# Patient Record
Sex: Female | Born: 1935 | Race: White | Hispanic: No | Smoking: Never smoker
Health system: Southern US, Community
[De-identification: ages and names within clinical notes are randomized; demographics above are authoritative.]

## PROBLEM LIST (undated history)

## (undated) DIAGNOSIS — C50919 Malignant neoplasm of unspecified site of unspecified female breast: Secondary | ICD-10-CM

## (undated) DIAGNOSIS — D509 Iron deficiency anemia, unspecified: Secondary | ICD-10-CM

## (undated) DIAGNOSIS — F0390 Unspecified dementia without behavioral disturbance: Secondary | ICD-10-CM

## (undated) DIAGNOSIS — I10 Essential (primary) hypertension: Secondary | ICD-10-CM

## (undated) HISTORY — PX: OTHER SURGICAL HISTORY: SHX169

---

## 2011-02-09 DIAGNOSIS — R002 Palpitations: Secondary | ICD-10-CM

## 2011-02-09 HISTORY — DX: Palpitations: R00.2

## 2016-03-03 ENCOUNTER — Encounter (HOSPITAL_COMMUNITY): Payer: Self-pay | Admitting: *Deleted

## 2016-03-03 ENCOUNTER — Emergency Department (HOSPITAL_COMMUNITY)
Admission: EM | Admit: 2016-03-03 | Discharge: 2016-03-04 | Disposition: A | Discharge status: Home / Self Care | Payer: Medicare Other | Attending: Emergency Medicine | Admitting: Emergency Medicine

## 2016-03-03 ENCOUNTER — Emergency Department (HOSPITAL_COMMUNITY): Payer: Medicare Other

## 2016-03-03 DIAGNOSIS — R930 Abnormal findings on diagnostic imaging of skull and head, not elsewhere classified: Secondary | ICD-10-CM | POA: Diagnosis not present

## 2016-03-03 DIAGNOSIS — S3992XA Unspecified injury of lower back, initial encounter: Secondary | ICD-10-CM | POA: Diagnosis present

## 2016-03-03 DIAGNOSIS — S32010A Wedge compression fracture of first lumbar vertebra, initial encounter for closed fracture: Secondary | ICD-10-CM

## 2016-03-03 DIAGNOSIS — S0181XA Laceration without foreign body of other part of head, initial encounter: Secondary | ICD-10-CM | POA: Diagnosis not present

## 2016-03-03 DIAGNOSIS — W1839XA Other fall on same level, initial encounter: Secondary | ICD-10-CM | POA: Insufficient documentation

## 2016-03-03 DIAGNOSIS — W19XXXA Unspecified fall, initial encounter: Secondary | ICD-10-CM

## 2016-03-03 DIAGNOSIS — S32018A Other fracture of first lumbar vertebra, initial encounter for closed fracture: Secondary | ICD-10-CM | POA: Diagnosis not present

## 2016-03-03 DIAGNOSIS — Y939 Activity, unspecified: Secondary | ICD-10-CM | POA: Diagnosis not present

## 2016-03-03 DIAGNOSIS — Y92002 Bathroom of unspecified non-institutional (private) residence single-family (private) house as the place of occurrence of the external cause: Secondary | ICD-10-CM | POA: Insufficient documentation

## 2016-03-03 DIAGNOSIS — D709 Neutropenia, unspecified: Secondary | ICD-10-CM | POA: Diagnosis not present

## 2016-03-03 DIAGNOSIS — Y999 Unspecified external cause status: Secondary | ICD-10-CM | POA: Insufficient documentation

## 2016-03-03 DIAGNOSIS — D649 Anemia, unspecified: Secondary | ICD-10-CM

## 2016-03-03 HISTORY — DX: Unspecified dementia, unspecified severity, without behavioral disturbance, psychotic disturbance, mood disturbance, and anxiety: F03.90

## 2016-03-03 LAB — CBC WITH DIFFERENTIAL/PLATELET
BASOS ABS: 0 10*3/uL (ref 0.0–0.1)
Basophils Relative: 0 %
EOS ABS: 0 10*3/uL (ref 0.0–0.7)
EOS PCT: 0 %
HCT: 36.1 % (ref 36.0–46.0)
Hemoglobin: 11.4 g/dL — ABNORMAL LOW (ref 12.0–15.0)
LYMPHS PCT: 33 %
Lymphs Abs: 0.8 10*3/uL (ref 0.7–4.0)
MCH: 27.7 pg (ref 26.0–34.0)
MCHC: 31.6 g/dL (ref 30.0–36.0)
MCV: 87.8 fL (ref 78.0–100.0)
MONO ABS: 0.3 10*3/uL (ref 0.1–1.0)
Monocytes Relative: 12 %
Neutro Abs: 1.4 10*3/uL — ABNORMAL LOW (ref 1.7–7.7)
Neutrophils Relative %: 55 %
PLATELETS: 161 10*3/uL (ref 150–400)
RBC: 4.11 MIL/uL (ref 3.87–5.11)
RDW: 14.5 % (ref 11.5–15.5)
WBC: 2.5 10*3/uL — ABNORMAL LOW (ref 4.0–10.5)

## 2016-03-03 LAB — COMPREHENSIVE METABOLIC PANEL
ALT: 15 U/L (ref 14–54)
AST: 22 U/L (ref 15–41)
Albumin: 3.4 g/dL — ABNORMAL LOW (ref 3.5–5.0)
Alkaline Phosphatase: 47 U/L (ref 38–126)
Anion gap: 5 (ref 5–15)
BUN: 28 mg/dL — AB (ref 6–20)
CHLORIDE: 111 mmol/L (ref 101–111)
CO2: 27 mmol/L (ref 22–32)
Calcium: 8.8 mg/dL — ABNORMAL LOW (ref 8.9–10.3)
Creatinine, Ser: 0.6 mg/dL (ref 0.44–1.00)
GFR calc non Af Amer: >60 mL/min (ref >60)
Glucose, Bld: 92 mg/dL (ref 65–99)
POTASSIUM: 3.8 mmol/L (ref 3.5–5.1)
SODIUM: 143 mmol/L (ref 135–145)
Total Bilirubin: 0.3 mg/dL (ref 0.3–1.2)
Total Protein: 5.9 g/dL — ABNORMAL LOW (ref 6.5–8.1)

## 2016-03-03 LAB — PROTIME-INR
INR: 0.96
PROTHROMBIN TIME: 12.8 s (ref 11.4–15.2)

## 2016-03-03 LAB — I-STAT TROPONIN, ED: Troponin i, poc: 0.01 ng/mL (ref 0.00–0.08)

## 2016-03-03 LAB — I-STAT CG4 LACTIC ACID, ED: LACTIC ACID, VENOUS: 0.82 mmol/L (ref 0.5–1.9)

## 2016-03-03 MED ORDER — TETANUS-DIPHTH-ACELL PERTUSSIS 5-2.5-18.5 LF-MCG/0.5 IM SUSP
0.5000 mL | Freq: Once | INTRAMUSCULAR | Status: AC
  Administered 2016-03-03: 0.5 mL via INTRAMUSCULAR
  Filled 2016-03-03: qty 0.5

## 2016-03-03 NOTE — ED Notes (Signed)
Family at bedside. 

## 2016-03-03 NOTE — ED Notes (Signed)
Bed: [facility] Expected date:  Expected time:  Means of arrival:  Comments: EMS-fall 

## 2016-03-03 NOTE — ED Notes (Signed)
Caregiver is at bedside.  Pt denies any pain at this time.

## 2016-03-03 NOTE — ED Provider Notes (Signed)
[facility] DEPT Provider Note   CSN: [id number] Arrival date & time: 03/03/16  1919  By signing my name below, I, Gwenlyn Fudge, attest that this documentation has been prepared under the direction and in the presence of [address] [place]. Electronically Signed: Gwenlyn Fudge, ED Scribe. 03/03/16. 8:44 PM.   History   Chief Complaint Chief Complaint  Patient presents with  . Fall   LEVEL 5 CAVEAT DUE TO DEMENTIA  The history is provided by a caregiver. No language interpreter was used.  Fall  This is a new problem. The current episode started 1 to 2 hours ago. The problem occurs rarely. The problem has not changed since onset.Nothing aggravates the symptoms. Nothing relieves the symptoms. She has tried nothing for the symptoms. The treatment provided no relief.   HPI Comments: Amanda Randall is a 83 y.o. female with a PMHx of dementia, HTN, HLD, periph neuropathy, iron and B12 deficiency anemia, and seborrheic dermatitis of the scalp, who presents to the Emergency Department from [facility] accompanied by her caregiver, complaining of a hematoma and laceration to the right forehead s/p an unwittnessed fall in the bathroom around 7 PM ~1hr PTA. LEVEL 5 CAVEAT DUE TO DEMENTIA, all history provided by caregiver. Per caregiver, pt was using the bathroom when she fell and struck her head. According to her caregiver, she had been acting "abnormal" all day, even prior to the fall, for example she urinated in the closet which is abnormal behavior for her; she reports she hasn't been any more confused since the fall, but has been more fidgety since her fall. Caregiver has noticed the patient has had a productive cough recently, although she's not sure when it started. Caregiver reports that she normally is not oriented to place and time, so she is currently oriented at baseline. She had medications changed recently in order to increase her weight, but she's not sure of what medications she takes or  what was recently changed. Caregiver is unsure if pt currently takes blood thinners. Caregiver denies any increased urinary frequency, malodorous urine, hematuria, recent fevers, rhinorrhea, LOC, vomiting, diarrhea, constipation, or any other symptoms; ROS limited due to dementia; pt denies any pain anywhere. Unaware of last tetanus.   Chart review does not reveal any blood thinner use. Per chart review, pt was started on remeron; pt on lopressor, lisinopril, iron supplement, namenda, exelon, and donepezil; was stopped on lyrica last year.    Past Medical History:  Diagnosis Date  . Dementia     There are no active problems to display for this patient.   History reviewed. No pertinent surgical history.  OB History    No data available       Home Medications    Prior to Admission medications   Not on File    Family History No family history on file.  Social History Social History  Substance Use Topics  . Smoking status: Never Smoker  . Smokeless tobacco: Never Used  . Alcohol use No     Allergies   Patient has no known allergies.   Review of Systems Review of Systems  Unable to perform ROS: Dementia  Constitutional: Negative for fever.  HENT: Positive for facial swelling (R forehead lac). Negative for rhinorrhea.   Respiratory: Positive for cough.   Gastrointestinal: Negative for constipation, diarrhea and vomiting.  Genitourinary: Negative for frequency and hematuria.       No Malodorous urine  Skin: Positive for wound.  Neurological: Negative for syncope.  Hematological:  Bruises/bleeds easily: unknown.  Psychiatric/Behavioral: Positive for confusion (prior to fall).  LEVEL 5 CAVEAT DUE TO DEMENTIA  Physical Exam Updated Vital Signs BP 145/69 (BP Location: Left Arm)   Pulse 74   Temp 98.6 F (37 C) (Oral)   Resp 17   SpO2 99%   Physical Exam  Constitutional: Vital signs are normal. She appears well-developed and well-nourished.  Non-toxic  appearance. No distress.  Afebrile, nontoxic, NAD  HENT:  Head: Normocephalic. Head is with laceration. Head is without raccoon's eyes and without Battle's sign.  Mouth/Throat: Oropharynx is clear and moist and mucous membranes are normal.  Small 1 cm linear laceration to the right forehead with small hematoma, and another small skin tear underneath the laceration; with no ongoing bleeding, fairly superficial and no retained FBs, no Raccoon's eyes or Battle's signs, no skull depression or bony instability  Eyes: Conjunctivae and EOM are normal. Pupils are equal, round, and reactive to light. Right eye exhibits no discharge. Left eye exhibits no discharge.  PERRL, EOMI, no nystagmus  Neck: Normal range of motion. Neck supple. No spinous process tenderness and no muscular tenderness present. No neck rigidity. Normal range of motion present.  FROM intact without spinous process TTP, no bony stepoffs or deformities, no paraspinous muscle TTP or muscle spasms. No rigidity or meningeal signs. No bruising or swelling.   Cardiovascular: Normal rate, regular rhythm, normal heart sounds and intact distal pulses.  Exam reveals no gallop and no friction rub.   No murmur heard. Pulmonary/Chest: Effort normal and breath sounds normal. No respiratory distress. She has no decreased breath sounds. She has no wheezes. She has no rhonchi. She has no rales.  Abdominal: Soft. Normal appearance and bowel sounds are normal. She exhibits no distension. There is no tenderness. There is no rigidity, no rebound, no guarding, no CVA tenderness, no tenderness at McBurney's point and negative Murphy's sign.  Musculoskeletal: Normal range of motion.  MAE x4 Strength and sensation grossly intact in all extremities Distal pulses intact No pelvic instability or tenderness No bony tenderness to all extremities  C-spine as above, all other spinal levels nonTTP without bony stepoffs or deformities   Neurological: She is alert. She  has normal strength. No cranial nerve deficit or sensory deficit.  A&O x1 to self, but not to place or time which is baseline per caregiver No gross neuro deficits  strength and sensation grossly intact  Skin: Skin is warm and dry. Laceration noted. No rash noted.  Right forehead lac as mentioned above  Psychiatric: She has a normal mood and affect.  Nursing note and vitals reviewed.  ED Treatments / Results  DIAGNOSTIC STUDIES: Oxygen Saturation is 99% on RA, normal by my interpretation.    COORDINATION OF CARE: 8:41 PM Discussed treatment plan with caregiver at bedside which includes Chest XR and CT Head. Labs (all labs ordered are listed, but only abnormal results are displayed) Labs Reviewed  CBC WITH DIFFERENTIAL/PLATELET - Abnormal; Notable for the following:       Result Value   WBC 2.5 (*)    Hemoglobin 11.4 (*)    Neutro Abs 1.4 (*)    All other components within normal limits  COMPREHENSIVE METABOLIC PANEL - Abnormal; Notable for the following:    BUN 28 (*)    Calcium 8.8 (*)    Total Protein 5.9 (*)    Albumin 3.4 (*)    All other components within normal limits  URINALYSIS, ROUTINE W REFLEX MICROSCOPIC - Abnormal; Notable  for the following:    Hgb urine dipstick MODERATE (*)    All other components within normal limits  PROTIME-INR  I-STAT TROPOININ, ED  I-STAT CG4 LACTIC ACID, ED    EKG  EKG Interpretation  Date/Time:  Wednesday March 03 2016 22:10:19 EST Ventricular Rate:  64 PR Interval:    QRS Duration: 93 QT Interval:  437 QTC Calculation: 451 R Axis:   -69 Text Interpretation:  Ectopic atrial rhythm Short PR interval Left anterior fascicular block Anterior infarct, old No previous tracing Confirmed by KNOTT MD, DANIEL [phone]) on 03/03/2016 10:51:01 PM       Radiology Dg Chest 2 View  Result Date: 03/03/2016 CLINICAL DATA:  Status post fall, head injury EXAM: CHEST  2 VIEW COMPARISON:  None. FINDINGS: There is mild bilateral chronic  interstitial thickening. There is no focal parenchymal opacity. There is no pleural effusion or pneumothorax. The heart and mediastinal contours are unremarkable. There is a L1 vertebral body compression fracture of indeterminate age. IMPRESSION: No active cardiopulmonary disease. L1 vertebral body compression fracture of indeterminate age. Electronically Signed   By: Kathreen Devoid   On: 03/03/2016 21:30   Ct Head Wo Contrast  Result Date: 03/03/2016 CLINICAL DATA:  [place] tonight, struck head on toilet. RIGHT forehead laceration. Confusion. History of dementia. EXAM: CT HEAD WITHOUT CONTRAST CT CERVICAL SPINE WITHOUT CONTRAST TECHNIQUE: Multidetector CT imaging of the head and cervical spine was performed following the standard protocol without intravenous contrast. Multiplanar CT image reconstructions of the cervical spine were also generated. COMPARISON:  None. FINDINGS: CT HEAD FINDINGS BRAIN: Moderate to severe ventriculomegaly on the basis of global parenchymal brain volume loss. No intraparenchymal hemorrhage, mass effect nor midline shift. Confluent supratentorial white matter hypodensities. No acute large vascular territory infarcts. No abnormal extra-axial fluid collections. Basal cisterns are patent. VASCULAR: Moderate to severe calcific atherosclerosis of the carotid siphons. SKULL: No skull fracture.  Small RIGHT frontal scalp hematoma. SINUSES/ORBITS: Small RIGHT mastoid effusion. Mild paranasal sinus mucosal thickening. Status post bilateral ocular lens implants. The included ocular globes and orbital contents are non-suspicious. OTHER: None. CT CERVICAL SPINE FINDINGS ALIGNMENT: Straightened cervical lordosis. Minimal grade 1 C3-4 and C4-5 anterolisthesis associated with severe LEFT greater than RIGHT facet arthropathy. SKULL BASE AND VERTEBRAE: Cervical vertebral bodies and posterior elements are intact. Severe C5-6 degenerative disc and vacuum phenomena. Moderate C4-5 degenerative disc. Osteopenia  without destructive bony lesions. C1-2 articulation maintained with moderate arthropathy. SOFT TISSUES AND SPINAL CANAL: Nonacute. DISC LEVELS: No significant osseous canal stenosis. Severe LEFT C4-5 neural foraminal narrowing. UPPER CHEST: Biapical scarring. OTHER: Patient is cachectic. IMPRESSION: CT HEAD: No acute intracranial process. Small RIGHT frontal scalp hematoma without skull fracture. Moderate to severe global brain atrophy and moderate to severe chronic small vessel ischemic disease. CT CERVICAL SPINE: No acute fracture. Minimal grade 1 C3-4 and C4-5 anterolisthesis on degenerative basis. Severe LEFT C4-5 neural foraminal narrowing. Electronically Signed   By: Elon Alas M.D.   On: 03/03/2016 21:41   Ct Cervical Spine Wo Contrast  Result Date: 03/03/2016 CLINICAL DATA:  [place] tonight, struck head on toilet. RIGHT forehead laceration. Confusion. History of dementia. EXAM: CT HEAD WITHOUT CONTRAST CT CERVICAL SPINE WITHOUT CONTRAST TECHNIQUE: Multidetector CT imaging of the head and cervical spine was performed following the standard protocol without intravenous contrast. Multiplanar CT image reconstructions of the cervical spine were also generated. COMPARISON:  None. FINDINGS: CT HEAD FINDINGS BRAIN: Moderate to severe ventriculomegaly on the basis of global parenchymal brain volume loss.  No intraparenchymal hemorrhage, mass effect nor midline shift. Confluent supratentorial white matter hypodensities. No acute large vascular territory infarcts. No abnormal extra-axial fluid collections. Basal cisterns are patent. VASCULAR: Moderate to severe calcific atherosclerosis of the carotid siphons. SKULL: No skull fracture.  Small RIGHT frontal scalp hematoma. SINUSES/ORBITS: Small RIGHT mastoid effusion. Mild paranasal sinus mucosal thickening. Status post bilateral ocular lens implants. The included ocular globes and orbital contents are non-suspicious. OTHER: None. CT CERVICAL SPINE FINDINGS  ALIGNMENT: Straightened cervical lordosis. Minimal grade 1 C3-4 and C4-5 anterolisthesis associated with severe LEFT greater than RIGHT facet arthropathy. SKULL BASE AND VERTEBRAE: Cervical vertebral bodies and posterior elements are intact. Severe C5-6 degenerative disc and vacuum phenomena. Moderate C4-5 degenerative disc. Osteopenia without destructive bony lesions. C1-2 articulation maintained with moderate arthropathy. SOFT TISSUES AND SPINAL CANAL: Nonacute. DISC LEVELS: No significant osseous canal stenosis. Severe LEFT C4-5 neural foraminal narrowing. UPPER CHEST: Biapical scarring. OTHER: Patient is cachectic. IMPRESSION: CT HEAD: No acute intracranial process. Small RIGHT frontal scalp hematoma without skull fracture. Moderate to severe global brain atrophy and moderate to severe chronic small vessel ischemic disease. CT CERVICAL SPINE: No acute fracture. Minimal grade 1 C3-4 and C4-5 anterolisthesis on degenerative basis. Severe LEFT C4-5 neural foraminal narrowing. Electronically Signed   By: Elon Alas M.D.   On: 03/03/2016 21:41    Procedures .Marland KitchenLaceration Repair Date/Time: 03/03/2016 11:12 PM Performed by: Lelon Frohlich, Erendida Wrenn Authorized by: Lelon Frohlich, Tian Mcmurtrey   Consent:    Consent obtained:  Verbal   Consent given by:  Healthcare agent   Risks discussed:  Poor cosmetic result and poor wound healing   Alternatives discussed:  Delayed treatment, observation, referral and no treatment Anesthesia (see MAR for exact dosages):    Anesthesia method:  None Laceration details:    Location:  Face   Face location:  Forehead   Length (cm):  1 Repair type:    Repair type:  Simple Pre-procedure details:    Preparation:  Patient was prepped and draped in usual sterile fashion Exploration:    Hemostasis achieved with:  Direct pressure   Wound exploration: wound explored through full range of motion and entire depth of wound probed and visualized     Contaminated: no     Treatment:    Area cleansed with:  Saline   Amount of cleaning:  Standard   Irrigation solution:  Sterile saline Skin repair:    Repair method:  Tissue adhesive Approximation:    Approximation:  Close   Vermilion border: well-aligned   Post-procedure details:    Dressing:  Open (no dressing)   Patient tolerance of procedure:  Tolerated well, no immediate complications   (including critical care time)  Medications Ordered in ED Medications  Tdap (BOOSTRIX) injection 0.5 mL (0.5 mLs Intramuscular Given 03/03/16 2220)     Initial Impression / Assessment and Plan / ED Course  I have reviewed the triage vital signs and the nursing notes.  Pertinent labs & imaging results that were available during my care of the patient were reviewed by me and considered in my medical decision making (see chart for details).     83 y.o. female here with unwitnessed fall in the bathroom earlier tonight; level 5 caveat due to dementia. Caregiver providing most info, although not tremendously helpful; unable to tell me if she's on blood thinners, and no meds listed on record here. On exam, no focal bony TTP anywhere, including spinal levels; no bony scalp instability, small R forehead lac, fairly superficial,  will likely dermabond this area. Will proceed with labs, imaging, and CXR since pt has been coughing recently. Will update Tdap and reassess after work up completed and lac repaired.  11:12 PM CBC w/diff with low WBC 2.5, which has been previously seen on outside labs last year; mentioned to caregiver that she needs to discuss this with her PCP at her f/up visit in 3-4 days; also shows stable anemia. CMP with mildly elevated BUN 28, albumin low. INR WNL. Lactic WNL. Trop WNL. EKG without acute ischemic findings. U/A not yet collected, will have this done ASAP. CXR negative aside from L1 compression fx of indeterminate age; brother at bedside now, states she fell several years ago and thinks the lumbar fx  was from that fall. CT head/neck without acute pathology. Dermabond applied with good hemostasis and cosmesis. Will await U/A results then reassess shortly  1:32 AM U/A with scant hematuria but otherwise negative and without signs of infection. Overall work up reassuring, pt ready for discharge. Discussed wound care, tylenol/motrin/ice use for pain/swelling, f/up with PCP in 3-4 days for recheck of wound and recheck labs as well as ongoing management of her remote L1 fx. I explained the diagnosis and have given explicit precautions to return to the ER including for any other new or worsening symptoms. The patient's caregiver understands and accepts the medical plan as it's been dictated and I have answered their questions. Discharge instructions concerning home care and prescriptions have been given. The patient is STABLE and is discharged to home in good condition.   I personally performed the services described in this documentation, which was scribed in my presence. The recorded information has been reviewed and is accurate.   Final Clinical Impressions(s) / ED Diagnoses   Final diagnoses:  Fall, initial encounter  Facial laceration, initial encounter  Neutropenia, unspecified type ([place])  Chronic anemia  Closed compression fracture of first lumbar vertebra, initial encounter [facility])    New Prescriptions New Prescriptions   No medications on file     [address] Andilyn Bettcher, PA-C 03/04/16 0133    Leo Grosser, MD 03/04/16 [phone]

## 2016-03-03 NOTE — ED Triage Notes (Signed)
Per EMS, pt from [facility], fell tonight in the BR hitting her head on her WC.  Pt is A&O per norm, has hx of dementia.  Lac noted to R forehead per EMS.

## 2016-03-03 NOTE — ED Notes (Signed)
Pt's caregiver reports she assisted pt to the BR and was sitting on the commode when she left her, states found pt less than a minute later on the floor in front of the sink.  Small skin avulsions noted to her R forehead.  Dried blood noted, no active bleeding noted.  Pt has dementia unable to provide hx of the fall.

## 2016-03-04 LAB — URINALYSIS, ROUTINE W REFLEX MICROSCOPIC
Bacteria, UA: NONE SEEN
Bilirubin Urine: NEGATIVE
Glucose, UA: NEGATIVE mg/dL
KETONES UR: NEGATIVE mg/dL
Leukocytes, UA: NEGATIVE
Nitrite: NEGATIVE
PH: 5 (ref 5.0–8.0)
Protein, ur: NEGATIVE mg/dL
SPECIFIC GRAVITY, URINE: 1.021 (ref 1.005–1.030)
SQUAMOUS EPITHELIAL / LPF: NONE SEEN

## 2016-03-04 NOTE — Discharge Instructions (Signed)
Keep wound clean with mild soap and water. Keep area covered with a topical bandage, keep bandage dry, and do not submerge in water for 24 hours. DO NOT APPLY ANY OINTMENTS/CREAMS/LOTIONS TO THE AREA OF SKIN GLUE. Ice and elevate area for additional pain relief and swelling. Alternate between Ibuprofen and Tylenol for additional pain relief. Your labs showed a mildly low white blood cell count, and your chest xray showed a mild compression fracture in your lower back which is likely old; you will need to follow up with your primary care doctor for recheck of these results/findings, and in approximately 3-4 days for wound recheck and recheck of symptoms. The skin glue will dissolve on its own over the next 10 days. Monitor area for signs of infection to include, but not limited to: increasing pain, spreading redness, drainage/pus, worsening swelling, or fevers. Return to emergency department for emergent changing or worsening symptoms.

## 2016-03-04 NOTE — ED Notes (Signed)
Report given [facility]

## 2016-07-25 ENCOUNTER — Encounter (HOSPITAL_COMMUNITY): Payer: Self-pay | Admitting: Emergency Medicine

## 2016-07-25 ENCOUNTER — Emergency Department (HOSPITAL_COMMUNITY)
Admission: EM | Admit: 2016-07-25 | Discharge: 2016-07-25 | Disposition: A | Discharge status: Home / Self Care | Payer: Medicare Other | Attending: Emergency Medicine | Admitting: Emergency Medicine

## 2016-07-25 DIAGNOSIS — R42 Dizziness and giddiness: Secondary | ICD-10-CM

## 2016-07-25 LAB — CBC WITH DIFFERENTIAL/PLATELET
BASOS ABS: 0 10*3/uL (ref 0.0–0.1)
BASOS PCT: 0 %
EOS ABS: 0.2 10*3/uL (ref 0.0–0.7)
Eosinophils Relative: 2 %
HCT: 42.9 % (ref 36.0–46.0)
Hemoglobin: 13.2 g/dL (ref 12.0–15.0)
Lymphocytes Relative: 19 %
Lymphs Abs: 1.4 10*3/uL (ref 0.7–4.0)
MCH: 27.6 pg (ref 26.0–34.0)
MCHC: 30.8 g/dL (ref 30.0–36.0)
MCV: 89.6 fL (ref 78.0–100.0)
MONO ABS: 0.6 10*3/uL (ref 0.1–1.0)
Monocytes Relative: 8 %
Neutro Abs: 5.4 10*3/uL (ref 1.7–7.7)
Neutrophils Relative %: 71 %
PLATELETS: 197 10*3/uL (ref 150–400)
RBC: 4.79 MIL/uL (ref 3.87–5.11)
RDW: 14.2 % (ref 11.5–15.5)
WBC: 7.6 10*3/uL (ref 4.0–10.5)

## 2016-07-25 LAB — URINALYSIS, ROUTINE W REFLEX MICROSCOPIC
Bilirubin Urine: NEGATIVE
GLUCOSE, UA: NEGATIVE mg/dL
Hgb urine dipstick: NEGATIVE
KETONES UR: NEGATIVE mg/dL
LEUKOCYTES UA: NEGATIVE
NITRITE: NEGATIVE
PROTEIN: NEGATIVE mg/dL
Specific Gravity, Urine: 1.025 (ref 1.005–1.030)
pH: 6 (ref 5.0–8.0)

## 2016-07-25 LAB — BASIC METABOLIC PANEL
Anion gap: 7 (ref 5–15)
BUN: 16 mg/dL (ref 6–20)
CO2: 31 mmol/L (ref 22–32)
CREATININE: 0.72 mg/dL (ref 0.44–1.00)
Calcium: 9 mg/dL (ref 8.9–10.3)
Chloride: 105 mmol/L (ref 101–111)
Glucose, Bld: 103 mg/dL — ABNORMAL HIGH (ref 65–99)
Potassium: 4.6 mmol/L (ref 3.5–5.1)
SODIUM: 143 mmol/L (ref 135–145)

## 2016-07-25 LAB — TROPONIN I

## 2016-07-25 MED ORDER — SODIUM CHLORIDE 0.9 % IV BOLUS (SEPSIS)
1000.0000 mL | Freq: Once | INTRAVENOUS | Status: AC
  Administered 2016-07-25: 1000 mL via INTRAVENOUS

## 2016-07-25 NOTE — ED Notes (Signed)
Patient has sitter from her facility that is at bedside.  Sitter reports that this is her first day with patient and unsure about her medications ro health history. Patient comes with no paperwork from SNF.

## 2016-07-25 NOTE — ED Notes (Signed)
Got called to go into room due to patient wanting to speak to the nurse.  As entering room, patient had covers pushed back, EKG leads partially way on and half patient removed.  Patient's personal Sitter at bedside stating, "She pulled them off (referring to EKG leads)."  Assisted patient removing the rest of EKG leads from her chest and covered back up with blankets.  Asked patient what she was doing, Patient responded, "I am trying to get back to my room".  Informed patient that she was at [facility] ED.   Patient asked "Why am I in the hospital?"  Informed patient that she was brought in due to her having headache and dizziness this morning.  Patient stated that she didn't remember that.  Patient kept asking when she could go back to her room.   Had to repeatedly remind patient that she is at the hospital, and about 45 minutes we should have her blood work results back making sure she had no infections and If all her tests come back good and she was feeling fine, then she would get to go back home.    Bed rails up and personal sitter at bedside.

## 2016-07-25 NOTE — ED Notes (Signed)
ED Provider at bedside. 

## 2016-07-25 NOTE — ED Provider Notes (Signed)
[facility] DEPT Provider Note   CSN: [id number] Arrival date & time: 07/25/16  0800     History   Chief Complaint Chief Complaint  Patient presents with  . Dizziness  . Headache  . Gait Problem    HPI Amanda Randall is a 83 y.o. female.  Pt presents to the ED today with dizziness.  Pt has dementia, so is not a good historian.  She has a Actuary from [employer] with who said pt felt dizzy when she stood up.  The pt was given tylenol by the facility.  She said that she feels ok now.  No dizziness.      Past Medical History:  Diagnosis Date  . Dementia     There are no active problems to display for this patient.   History reviewed. No pertinent surgical history.  OB History    No data available       Home Medications    Prior to Admission medications   Not on File    Family History No family history on file.  Social History Social History  Substance Use Topics  . Smoking status: Never Smoker  . Smokeless tobacco: Never Used  . Alcohol use No     Allergies   Patient has no known allergies.   Review of Systems Review of Systems  Neurological: Positive for dizziness.  All other systems reviewed and are negative.    Physical Exam Updated Vital Signs BP 117/81 (BP Location: Left Arm)   Pulse (!) 59   Temp 97.7 F (36.5 C) (Oral)   Resp 13   SpO2 99%   Physical Exam  Constitutional: She appears well-developed and well-nourished.  HENT:  Head: Normocephalic and atraumatic.  Right Ear: External ear normal.  Left Ear: External ear normal.  Nose: Nose normal.  Mouth/Throat: Oropharynx is clear and moist.  Eyes: Conjunctivae and EOM are normal. Pupils are equal, round, and reactive to light.  Neck: Normal range of motion. Neck supple.  Cardiovascular: Normal rate, regular rhythm, normal heart sounds and intact distal pulses.   Pulmonary/Chest: Effort normal and breath sounds normal.  Abdominal: Soft. Bowel sounds are normal.    Musculoskeletal: Normal range of motion.  Neurological: She is alert.  Pt knows her name and that she's at the hospital.  She is moving all 4 extremities.  She is able to walk with a walker without problems.  Skin: Skin is warm and dry.  Psychiatric: She has a normal mood and affect. Her behavior is normal. Judgment and thought content normal.  Nursing note and vitals reviewed.    ED Treatments / Results  Labs (all labs ordered are listed, but only abnormal results are displayed) Labs Reviewed  BASIC METABOLIC PANEL - Abnormal; Notable for the following:       Result Value   Glucose, Bld 103 (*)    All other components within normal limits  TROPONIN I  CBC WITH DIFFERENTIAL/PLATELET  URINALYSIS, ROUTINE W REFLEX MICROSCOPIC    EKG  EKG Interpretation  Date/Time:  Sunday July 25 2016 08:28:40 EDT Ventricular Rate:  60 PR Interval:    QRS Duration: 87 QT Interval:  436 QTC Calculation: 436 R Axis:   -40 Text Interpretation:  Sinus rhythm Left axis deviation No significant change since last tracing Confirmed by Isla Pence [phone]) on 07/25/2016 8:45:48 AM       Radiology No results found.  Procedures Procedures (including critical care time)  Medications Ordered in ED Medications  sodium chloride 0.9 %  bolus 1,000 mL (1,000 mLs Intravenous New Bag/Given 07/25/16 0854)     Initial Impression / Assessment and Plan / ED Course  I have reviewed the triage vital signs and the nursing notes.  Pertinent labs & imaging results that were available during my care of the patient were reviewed by me and considered in my medical decision making (see chart for details).    Pt able to walk with her walker without problems.  She no longer feels dizzy, so I don't think she needs further work up here.  The pt is stable for d/c.    Final Clinical Impressions(s) / ED Diagnoses   Final diagnoses:  Dizziness    New Prescriptions New Prescriptions   No medications on file      Isla Pence, MD 07/25/16 [phone]

## 2016-07-25 NOTE — ED Notes (Signed)
Bed: VO59 Expected date:  Expected time:  Means of arrival:  Comments: EMS/dizzy/resolved

## 2016-07-25 NOTE — ED Triage Notes (Signed)
Patient brought in by [facility] from [facility] for c/o of headache, dizziness and having unsteady gait while ambulating with her walker this morning.  Patient was given tylenol at the facility for the headache.  Patient denies any pain with EMS or any other complaints.   Patient states to me that she might be a little dizzy but not bad at this time and denies any pain.

## 2016-10-17 ENCOUNTER — Emergency Department (HOSPITAL_COMMUNITY)
Admission: EM | Admit: 2016-10-17 | Discharge: 2016-10-17 | Disposition: A | Discharge status: Home / Self Care | Payer: Medicare Other | Attending: Emergency Medicine | Admitting: Emergency Medicine

## 2016-10-17 DIAGNOSIS — L819 Disorder of pigmentation, unspecified: Secondary | ICD-10-CM | POA: Insufficient documentation

## 2016-10-17 DIAGNOSIS — F0390 Unspecified dementia without behavioral disturbance: Secondary | ICD-10-CM | POA: Diagnosis not present

## 2016-10-17 NOTE — ED Triage Notes (Signed)
It was reported by staff at [facility] that, this morning pt's. Toes were "blue". It was also discovered that pt. Had inadvertently left on her T.E.D. Hose all night. She arrives in no distress. CMS intact all toes bilat.

## 2016-10-17 NOTE — Discharge Instructions (Signed)
Make sure she stays well hydrated. If she starts to have discoloration of her feet, make sure that she is still ambulatory and assess if symptoms improve with ambulation. Return to the emergency room if she has persistent discoloration, inability to walk, or any new or worsening symptoms.

## 2016-10-17 NOTE — ED Provider Notes (Signed)
[facility] DEPT Provider Note   CSN: [id number] Arrival date & time: 10/17/16  0740     History   Chief Complaint Chief Complaint  Patient presents with  . Leg Problem    HPI Amanda Randall is a 83 y.o. female presenting with concerns of poor pedal circulation.  Level V caveat, as patient has dementia.  Per caregiver, patient fell asleep in her TED hose last night. When caregiver was changing her this morning, patient's bilateral feet appeared blue/dusky. This resolved without intervention. EMS was called, and family requested her to come to the emergency room for evaluation. Patient denies any pain. She is ambulatory at baseline. No history of blood clots or arterial occlusion. Patient states she does not know why she is in the emergency room and has no complaints at this time.  HPI  Past Medical History:  Diagnosis Date  . Dementia     There are no active problems to display for this patient.   No past surgical history on file.  OB History    No data available       Home Medications    Prior to Admission medications   Not on File    Family History No family history on file.  Social History Social History  Substance Use Topics  . Smoking status: Never Smoker  . Smokeless tobacco: Never Used  . Alcohol use No     Allergies   Patient has no known allergies.   Review of Systems Review of Systems  Unable to perform ROS: Dementia     Physical Exam Updated Vital Signs BP 116/70 (BP Location: Left Arm)   Pulse 70   Temp 97.8 F (36.6 C) (Oral)   Resp 17   SpO2 95%   Physical Exam  Constitutional: She is oriented to person, place, and time. She appears well-developed and well-nourished. No distress.  HENT:  Head: Normocephalic and atraumatic.  Eyes: EOM are normal.  Neck: Normal range of motion.  Cardiovascular: Normal rate and regular rhythm.   All extremities appear to be perfusing well. Good capillary refill in all toes. No bluish or  dusky discoloration. Pedal pulses intact and equal bilaterally. Patient with full active range of motion of ankles and toes without pain. Strength intact bilaterally. Soft compartments. No tenderness or swelling of the calves.  Pulmonary/Chest: Effort normal.  Abdominal: She exhibits no distension.  Musculoskeletal: Normal range of motion. She exhibits no edema.  Neurological: She is alert and oriented to person, place, and time.  Skin: Skin is warm. No rash noted.  Psychiatric: She has a normal mood and affect.  Nursing note and vitals reviewed.    ED Treatments / Results  Labs (all labs ordered are listed, but only abnormal results are displayed) Labs Reviewed - No data to display  EKG  EKG Interpretation None       Radiology No results found.  Procedures Procedures (including critical care time)  Medications Ordered in ED Medications - No data to display   Initial Impression / Assessment and Plan / ED Course  I have reviewed the triage vital signs and the nursing notes.  Pertinent labs & imaging results that were available during my care of the patient were reviewed by me and considered in my medical decision making (see chart for details).     Pt presenting as caregiver was concerned about bilateral foot discoloration. No pain, and symptoms resolved. Physical exam reassuring, as patient has pedal pulses and is neurovascularly intact. Patient is  ambulatory without difficulty. Full range of motion of toes and feet. Doubt arterial occlusion or DVT at this time. Case discussed with attending, Dr. Regenia Skeeter evaluated the patient. Discussed findings with caregiver. At this time, patient presented for discharge. Return precautions given. Caregiver and patient state they understand and agree to plan.  Final Clinical Impressions(s) / ED Diagnoses   Final diagnoses:  Discoloration of skin of foot    New Prescriptions New Prescriptions   No medications on file       Franchot Heidelberg, PA-C 10/17/16 [id number]    Sherwood Gambler, MD 10/21/16 1815

## 2016-10-17 NOTE — ED Notes (Signed)
Bed: JY78 Expected date:  Expected time:  Means of arrival:  Comments: 83 yo no complaints- family wants eval

## 2017-03-12 ENCOUNTER — Emergency Department (HOSPITAL_COMMUNITY): Payer: Medicare Other

## 2017-03-12 ENCOUNTER — Emergency Department (HOSPITAL_COMMUNITY)
Admission: EM | Admit: 2017-03-12 | Discharge: 2017-03-12 | Disposition: A | Discharge status: Home / Self Care | Payer: Medicare Other | Attending: Emergency Medicine | Admitting: Emergency Medicine

## 2017-03-12 ENCOUNTER — Encounter (HOSPITAL_COMMUNITY): Payer: Self-pay | Admitting: Emergency Medicine

## 2017-03-12 DIAGNOSIS — W19XXXA Unspecified fall, initial encounter: Secondary | ICD-10-CM

## 2017-03-12 DIAGNOSIS — Y999 Unspecified external cause status: Secondary | ICD-10-CM | POA: Insufficient documentation

## 2017-03-12 DIAGNOSIS — W010XXA Fall on same level from slipping, tripping and stumbling without subsequent striking against object, initial encounter: Secondary | ICD-10-CM | POA: Insufficient documentation

## 2017-03-12 DIAGNOSIS — Y9210 Unspecified residential institution as the place of occurrence of the external cause: Secondary | ICD-10-CM | POA: Diagnosis not present

## 2017-03-12 DIAGNOSIS — Y929 Unspecified place or not applicable: Secondary | ICD-10-CM | POA: Insufficient documentation

## 2017-03-12 DIAGNOSIS — E860 Dehydration: Secondary | ICD-10-CM | POA: Diagnosis not present

## 2017-03-12 DIAGNOSIS — Z79899 Other long term (current) drug therapy: Secondary | ICD-10-CM | POA: Diagnosis not present

## 2017-03-12 DIAGNOSIS — F0390 Unspecified dementia without behavioral disturbance: Secondary | ICD-10-CM | POA: Insufficient documentation

## 2017-03-12 DIAGNOSIS — I959 Hypotension, unspecified: Secondary | ICD-10-CM | POA: Diagnosis present

## 2017-03-12 LAB — URINALYSIS, ROUTINE W REFLEX MICROSCOPIC
Bilirubin Urine: NEGATIVE
GLUCOSE, UA: NEGATIVE mg/dL
HGB URINE DIPSTICK: NEGATIVE
KETONES UR: NEGATIVE mg/dL
LEUKOCYTES UA: NEGATIVE
Nitrite: NEGATIVE
PROTEIN: NEGATIVE mg/dL
Specific Gravity, Urine: 1.008 (ref 1.005–1.030)
pH: 6 (ref 5.0–8.0)

## 2017-03-12 LAB — CBC WITH DIFFERENTIAL/PLATELET
Basophils Absolute: 0 10*3/uL (ref 0.0–0.1)
Basophils Relative: 0 %
Eosinophils Absolute: 0.1 10*3/uL (ref 0.0–0.7)
Eosinophils Relative: 2 %
HEMATOCRIT: 34.3 % — AB (ref 36.0–46.0)
HEMOGLOBIN: 11 g/dL — AB (ref 12.0–15.0)
LYMPHS ABS: 1 10*3/uL (ref 0.7–4.0)
LYMPHS PCT: 19 %
MCH: 29 pg (ref 26.0–34.0)
MCHC: 32.1 g/dL (ref 30.0–36.0)
MCV: 90.5 fL (ref 78.0–100.0)
MONO ABS: 0.4 10*3/uL (ref 0.1–1.0)
Monocytes Relative: 8 %
NEUTROS ABS: 3.8 10*3/uL (ref 1.7–7.7)
NEUTROS PCT: 71 %
Platelets: 194 10*3/uL (ref 150–400)
RBC: 3.79 MIL/uL — ABNORMAL LOW (ref 3.87–5.11)
RDW: 13.4 % (ref 11.5–15.5)
WBC: 5.3 10*3/uL (ref 4.0–10.5)

## 2017-03-12 LAB — I-STAT TROPONIN, ED: TROPONIN I, POC: 0.01 ng/mL (ref 0.00–0.08)

## 2017-03-12 LAB — COMPREHENSIVE METABOLIC PANEL
ALK PHOS: 49 U/L (ref 38–126)
ALT: 8 U/L — ABNORMAL LOW (ref 14–54)
ANION GAP: 5 (ref 5–15)
AST: 25 U/L (ref 15–41)
Albumin: 2.7 g/dL — ABNORMAL LOW (ref 3.5–5.0)
BILIRUBIN TOTAL: 0.6 mg/dL (ref 0.3–1.2)
BUN: 20 mg/dL (ref 6–20)
CALCIUM: 7.7 mg/dL — AB (ref 8.9–10.3)
CO2: 24 mmol/L (ref 22–32)
Chloride: 110 mmol/L (ref 101–111)
Creatinine, Ser: 0.78 mg/dL (ref 0.44–1.00)
GFR calc Af Amer: >60 mL/min (ref >60)
Glucose, Bld: 74 mg/dL (ref 65–99)
POTASSIUM: 4.6 mmol/L (ref 3.5–5.1)
Sodium: 139 mmol/L (ref 135–145)
TOTAL PROTEIN: 5 g/dL — AB (ref 6.5–8.1)

## 2017-03-12 MED ORDER — SODIUM CHLORIDE 0.9 % IV BOLUS (SEPSIS)
1000.0000 mL | Freq: Once | INTRAVENOUS | Status: AC
  Administered 2017-03-12: 1000 mL via INTRAVENOUS

## 2017-03-12 NOTE — ED Notes (Signed)
Mary at [facility] made aware patient's brother will be transporting her back to facility.

## 2017-03-12 NOTE — ED Notes (Signed)
IV removed from left forearm.

## 2017-03-12 NOTE — ED Triage Notes (Signed)
Per EMS, patient from [facility], staff reported witnessed trip and fall with walker this morning. Did hit head, denies blood thinners and LOC. Patient hypotensive 80/50 with EMS. Denies pain. Oriented to baseline. Hx dementia.

## 2017-03-12 NOTE — ED Provider Notes (Signed)
[facility] DEPT Provider Note   CSN: [id number] Arrival date & time: 03/12/17  1139     History   Chief Complaint Chief Complaint  Patient presents with  . Fall  . Hypotension    HPI Amanda Randall is a 84 y.o. female.  HPI  84 year old female with a history of dementia presents with concern for witnessed fall from the facility and was found to have blood pressures in the 80s with EMS.  Patient has history of dementia, history is limited, however patient denies any areas of pain, denies headache, neck pain, chest pain, abdominal pain, shortness of breath, nausea, vomiting or diarrhea.  She does not recall the fall.  The facility did not report any other concerns other than this witnessed fall prior to EMS transport.  EMS reports that her blood pressures have been 86 systolic.  They started an IV and gave her 100 cc of normal saline so far without change.  Patient is alert and denies any concerns.  Facility had reported that she appeared to trip and fall with her walker, falling backwards and hitting the back of her head.  She is not on blood thinners, had no loss of consciousness.  Past Medical History:  Diagnosis Date  . Dementia     There are no active problems to display for this patient.   History reviewed. No pertinent surgical history.  OB History    No data available       Home Medications    Prior to Admission medications   Medication Sig Start Date End Date Taking? Authorizing Provider  aspirin 81 MG chewable tablet Chew 81 mg by mouth daily.   Yes [provider]  citalopram (CELEXA) 20 MG tablet Take 20 mg by mouth daily.   Yes [provider]  cyanocobalamin 1000 MCG tablet Take 1,000 mcg by mouth daily.   Yes [provider]  diazepam (VALIUM) 2 MG tablet Take 2 mg by mouth 2 (two) times daily.   Yes [provider]  ENSURE (ENSURE) Take 1 Can by mouth 2 (two) times daily between meals.   Yes  [provider]  ferrous sulfate 325 (65 FE) MG tablet Take 325 mg by mouth daily with breakfast.   Yes [provider]  lisinopril (PRINIVIL,ZESTRIL) 20 MG tablet Take 20 mg by mouth daily.   Yes [provider]  memantine (NAMENDA) 10 MG tablet Take 10 mg by mouth 2 (two) times daily.   Yes [provider]  metoprolol tartrate (LOPRESSOR) 25 MG tablet Take 25 mg by mouth 2 (two) times daily.   Yes [provider]  mirtazapine (REMERON) 7.5 MG tablet Take 7.5 mg by mouth at bedtime.   Yes [provider]  pregabalin (LYRICA) 50 MG capsule Take 50 mg by mouth daily.   Yes [provider]  rivastigmine (EXELON) 3 MG capsule Take 3 mg by mouth 2 (two) times daily.   Yes [provider]    Family History No family history on file.  Social History Social History   Tobacco Use  . Smoking status: Never Smoker  . Smokeless tobacco: Never Used  Substance Use Topics  . Alcohol use: No  . Drug use: No     Allergies   Patient has no known allergies.   Review of Systems Review of Systems  Constitutional: Negative for fever.  HENT: Negative for sore throat.   Eyes: Negative for visual disturbance.  Respiratory: Negative for cough and  shortness of breath.   Cardiovascular: Negative for chest pain.  Gastrointestinal: Negative for abdominal pain, nausea and vomiting.  Genitourinary: Negative for difficulty urinating.  Musculoskeletal: Negative for back pain and neck pain.  Skin: Negative for rash.  Neurological: Negative for syncope, light-headedness and headaches.     Physical Exam Updated Vital Signs BP (!) 153/73   Pulse 61   Temp (!) 96.9 F (36.1 C) (Oral)   Resp 15   SpO2 100%   Physical Exam  Constitutional: She is oriented to person, place, and time. She appears well-developed and well-nourished. No distress.  HENT:  Head: Normocephalic and atraumatic.  Eyes: Conjunctivae and EOM are normal.    Neck: Normal range of motion.  Cardiovascular: Normal rate, regular rhythm, normal heart sounds and intact distal pulses. Exam reveals no gallop and no friction rub.  No murmur heard. Pulmonary/Chest: Effort normal and breath sounds normal. No respiratory distress. She has no wheezes. She has no rales.  Abdominal: Soft. She exhibits no distension. There is no tenderness. There is no guarding.  Musculoskeletal: She exhibits no edema or tenderness.  Neurological: She is alert and oriented to person, place, and time.  Skin: Skin is warm and dry. No rash noted. She is not diaphoretic. No erythema.  Nursing note and vitals reviewed.    ED Treatments / Results  Labs (all labs ordered are listed, but only abnormal results are displayed) Labs Reviewed  CBC WITH DIFFERENTIAL/PLATELET - Abnormal; Notable for the following components:      Result Value   RBC 3.79 (*)    Hemoglobin 11.0 (*)    HCT 34.3 (*)    All other components within normal limits  COMPREHENSIVE METABOLIC PANEL - Abnormal; Notable for the following components:   Calcium 7.7 (*)    Total Protein 5.0 (*)    Albumin 2.7 (*)    ALT 8 (*)    All other components within normal limits  URINALYSIS, ROUTINE W REFLEX MICROSCOPIC - Abnormal; Notable for the following components:   Color, Urine STRAW (*)    All other components within normal limits  URINE CULTURE  I-STAT TROPONIN, ED    EKG  EKG Interpretation  Date/Time:  Saturday March 12 2017 11:58:57 EST Ventricular Rate:  61 PR Interval:    QRS Duration: 114 QT Interval:  443 QTC Calculation: 447 R Axis:   -72 Text Interpretation:  Sinus rhythm Left anterior fascicular block Baseline wander in lead(s) V3 Since prior ECG, patient now has left anterior fascicular block-had been noted on prior but more significant now.  Confirmed by Gareth Morgan [phone]) on 03/12/2017 12:03:01 PM       Radiology Dg Chest 2 View  Result Date: 03/12/2017 CLINICAL DATA:  Trip and  fall. EXAM: CHEST  2 VIEW COMPARISON:  March 03, 2016 FINDINGS: Elevated right hemidiaphragm is identified. The heart, hila, mediastinum, lungs, and pleura are otherwise unremarkable. A compression fracture of an upper lumbar vertebral body or lower thoracic vertebral body is stable since March 03, 2016. No acute fractures are seen. IMPRESSION: Stable compression fracture in the upper lumbar or lower thoracic spine. No acute abnormalities seen within the chest. Electronically Signed   By: Dorise Bullion III M.D   On: 03/12/2017 12:33   Ct Head Wo Contrast  Result Date: 03/12/2017 CLINICAL DATA:  Status post fall. EXAM: CT HEAD WITHOUT CONTRAST TECHNIQUE: Contiguous axial images were obtained from the base of the skull through the vertex without intravenous contrast. COMPARISON:  03/03/2016 FINDINGS:  Brain: No evidence of acute infarction, hemorrhage, extra-axial collection, ventriculomegaly, or mass effect. Old small area of low density in the right parietal lobe likely reflecting sequela prior insult. Generalized cerebral atrophy. Periventricular white matter low attenuation likely secondary to microangiopathy. Vascular: Cerebrovascular atherosclerotic calcifications are noted. Skull: Negative for fracture or focal lesion. Sinuses/Orbits: Visualized portions of the orbits are unremarkable. Visualized portions of the paranasal sinuses and mastoid air cells are unremarkable. Other: None. IMPRESSION: 1. No acute intracranial pathology. Electronically Signed   By: Kathreen Devoid   On: 03/12/2017 12:50    Procedures Procedures (including critical care time)  Medications Ordered in ED Medications  sodium chloride 0.9 % bolus 1,000 mL (0 mLs Intravenous Stopped 03/12/17 1520)     Initial Impression / Assessment and Plan / ED Course  I have reviewed the triage vital signs and the nursing notes.  Pertinent labs & imaging results that were available during my care of the patient were reviewed by me and  considered in my medical decision making (see chart for details).     84 year old female with a history of dementia presents with concern for witnessed fall from the facility and was found to have blood pressures in the 80s with EMS.  Initial blood pressure in the emergency department 96/51, with rapid improvement with fluids.  Labs show no significant electrolyte abnormalities, no significant anemia.  Troponin is within normal limits.  Urinalysis showed no sign of infection.  She is afebrile, well-appearing, without pain or other concerns.  Regarding fall, CT head was done which was within normal limits.  Patient with normal neurologic exam, no tenderness in the cervical, thoracic or lumbar spine, full ROM of limbs.  Have low suspicion for other injuries by history and physical.  Patient with mechanical fall, and suspect initial low blood pressure secondary to dehydration.  She is well-appearing, asymptomatic, appropriate for outpatient follow-up.  Discussed results with her brother. Patient discharged in stable condition with understanding of reasons to return.   Final Clinical Impressions(s) / ED Diagnoses   Final diagnoses:  Fall, initial encounter  Dehydration    ED Discharge Orders    None       Gareth Morgan, MD 03/12/17 [phone]

## 2017-03-12 NOTE — ED Notes (Signed)
Patient's brother at bedside.

## 2017-03-12 NOTE — ED Notes (Signed)
Bed: WA06 Expected date: 03/12/17 Expected time: 11:35 AM Means of arrival: Ambulance Comments: Fall

## 2017-03-12 NOTE — ED Notes (Signed)
Patient transported to X-ray 

## 2017-03-12 NOTE — ED Notes (Signed)
Patient transported to CT 

## 2017-03-13 LAB — URINE CULTURE: Culture: NO GROWTH

## 2017-03-26 ENCOUNTER — Other Ambulatory Visit: Payer: Self-pay

## 2017-03-26 ENCOUNTER — Emergency Department (HOSPITAL_COMMUNITY)
Admission: EM | Admit: 2017-03-26 | Discharge: 2017-03-26 | Disposition: A | Discharge status: Home / Self Care | Payer: Medicare Other | Attending: Emergency Medicine | Admitting: Emergency Medicine

## 2017-03-26 ENCOUNTER — Emergency Department (HOSPITAL_COMMUNITY): Payer: Medicare Other

## 2017-03-26 DIAGNOSIS — Z7982 Long term (current) use of aspirin: Secondary | ICD-10-CM | POA: Diagnosis not present

## 2017-03-26 DIAGNOSIS — F0390 Unspecified dementia without behavioral disturbance: Secondary | ICD-10-CM | POA: Diagnosis not present

## 2017-03-26 DIAGNOSIS — Y9389 Activity, other specified: Secondary | ICD-10-CM | POA: Insufficient documentation

## 2017-03-26 DIAGNOSIS — S0181XA Laceration without foreign body of other part of head, initial encounter: Secondary | ICD-10-CM | POA: Insufficient documentation

## 2017-03-26 DIAGNOSIS — Z79899 Other long term (current) drug therapy: Secondary | ICD-10-CM | POA: Insufficient documentation

## 2017-03-26 DIAGNOSIS — Z23 Encounter for immunization: Secondary | ICD-10-CM | POA: Diagnosis not present

## 2017-03-26 DIAGNOSIS — W19XXXA Unspecified fall, initial encounter: Secondary | ICD-10-CM | POA: Diagnosis not present

## 2017-03-26 DIAGNOSIS — S0191XA Laceration without foreign body of unspecified part of head, initial encounter: Secondary | ICD-10-CM

## 2017-03-26 DIAGNOSIS — Y999 Unspecified external cause status: Secondary | ICD-10-CM | POA: Diagnosis not present

## 2017-03-26 DIAGNOSIS — Y92122 Bedroom in nursing home as the place of occurrence of the external cause: Secondary | ICD-10-CM | POA: Insufficient documentation

## 2017-03-26 DIAGNOSIS — S0990XA Unspecified injury of head, initial encounter: Secondary | ICD-10-CM | POA: Diagnosis present

## 2017-03-26 MED ORDER — TETANUS-DIPHTHERIA TOXOIDS TD 5-2 LFU IM INJ
0.5000 mL | INJECTION | Freq: Once | INTRAMUSCULAR | Status: AC
  Administered 2017-03-26: 0.5 mL via INTRAMUSCULAR
  Filled 2017-03-26: qty 0.5

## 2017-03-26 NOTE — ED Notes (Signed)
Dermabond pulled and given to Dr.Messick

## 2017-03-26 NOTE — ED Notes (Signed)
Patient transported to CT 

## 2017-03-26 NOTE — ED Provider Notes (Signed)
[facility] Provider Note   CSN: [id number] Arrival date & time: 03/26/17  0946     History   Chief Complaint Chief Complaint  Patient presents with  . Fall  . Head Laceration    HPI Amanda Randall is a 84 y.o. female.  Was prior history of dementia arrives after a mechanical fall.  Patient was found in her room at at her facility after a mechanical fall.  She sustained skin tears to the right lateral knee and a small laceration to the right forehead.  Patient is at her baseline mental status now.  Secondary to her dementia she is not able to give a substantial history.  Majority of history obtained from EMS and from the patient's brother who is at bedside.  No other evident injury sustained from a fall this morning.  Cervical collar was placed by EMS prior to arrival.   The history is provided by the patient, medical records and the EMS personnel. The history is limited by the condition of the patient.  Fall  This is a new problem. The current episode started 3 to 5 hours ago. The problem occurs every several days. The problem has not changed since onset.Pertinent negatives include no chest pain and no abdominal pain. Nothing aggravates the symptoms. Nothing relieves the symptoms. She has tried nothing for the symptoms.    Past Medical History:  Diagnosis Date  . Dementia     There are no active problems to display for this patient.   No past surgical history on file.  OB History    No data available       Home Medications    Prior to Admission medications   Medication Sig Start Date End Date Taking? Authorizing Provider  aspirin 81 MG chewable tablet Chew 81 mg by mouth daily.    [provider]  citalopram (CELEXA) 20 MG tablet Take 20 mg by mouth daily.    [provider]  cyanocobalamin 1000 MCG tablet Take 1,000 mcg by mouth daily.    [provider]  diazepam (VALIUM) 2 MG tablet Take 2 mg by  mouth 2 (two) times daily.    [provider]  ENSURE (ENSURE) Take 1 Can by mouth 2 (two) times daily between meals.    [provider]  ferrous sulfate 325 (65 FE) MG tablet Take 325 mg by mouth daily with breakfast.    [provider]  lisinopril (PRINIVIL,ZESTRIL) 20 MG tablet Take 20 mg by mouth daily.    [provider]  memantine (NAMENDA) 10 MG tablet Take 10 mg by mouth 2 (two) times daily.    [provider]  metoprolol tartrate (LOPRESSOR) 25 MG tablet Take 25 mg by mouth 2 (two) times daily.    [provider]  mirtazapine (REMERON) 7.5 MG tablet Take 7.5 mg by mouth at bedtime.    [provider]  pregabalin (LYRICA) 50 MG capsule Take 50 mg by mouth daily.    [provider]  rivastigmine (EXELON) 3 MG capsule Take 3 mg by mouth 2 (two) times daily.    [provider]    Family History No family history on file.  Social History Social History   Tobacco Use  . Smoking status: Never Smoker  . Smokeless tobacco: Never Used  Substance Use Topics  . Alcohol use: No  . Drug use: No     Allergies   Patient has no known allergies.   Review  of Systems Review of Systems  Unable to perform ROS: Dementia  Cardiovascular: Negative for chest pain.  Gastrointestinal: Negative for abdominal pain.  All other systems reviewed and are negative.    Physical Exam Updated Vital Signs BP (!) 104/57 (BP Location: Right Arm)   Pulse 60   Temp 98.1 F (36.7 C) (Oral)   Resp 16   Ht 5' (1.524 m)   Wt 54.4 kg (120 lb)   SpO2 98%   BMI 23.44 kg/m   Physical Exam  Constitutional: She is oriented to person, place, and time. She appears well-developed and well-nourished. No distress.  HENT:  Head: Normocephalic.  Mouth/Throat: Oropharynx is clear and moist.  1.5 cm superficial laceration to the right forehead.  No step-off or deformity.  HEENT exam is otherwise atraumatic.  Eyes: Conjunctivae  and EOM are normal. Pupils are equal, round, and reactive to light.  Neck: Normal range of motion. Neck supple.  Cardiovascular: Normal rate, regular rhythm and normal heart sounds.  Pulmonary/Chest: Effort normal and breath sounds normal. No respiratory distress.  Abdominal: Soft. She exhibits no distension. There is no tenderness.  Musculoskeletal: Normal range of motion. She exhibits no edema or deformity.  Neurological: She is alert and oriented to person, place, and time.  Skin: Skin is warm and dry.  Superficial skin tears noted to the lateral aspect of the right knee.  No significant joint deformity or joint laxity noted.  Distal right lower extremity is neurovascular intact.  Psychiatric: She has a normal mood and affect.  Nursing note and vitals reviewed.    ED Treatments / Results  Labs (all labs ordered are listed, but only abnormal results are displayed) Labs Reviewed - No data to display  EKG  EKG Interpretation None       Radiology Dg Knee 2 Views Right  Result Date: 03/26/2017 CLINICAL DATA:  Fall with right knee pain. EXAM: RIGHT KNEE - 1-2 VIEW COMPARISON:  None. FINDINGS: Mild diffuse osteopenia. Chondrocalcinosis over the medial and lateral compartments. Very minimal osteoarthritic change. No acute fracture or dislocation. No joint effusion. IMPRESSION: No acute findings. Electronically Signed   By: Marin Olp M.D.   On: 03/26/2017 10:45   Ct Head Wo Contrast  Result Date: 03/26/2017 CLINICAL DATA:  Unwitnessed fall.  Right temporal injury. EXAM: CT HEAD WITHOUT CONTRAST CT CERVICAL SPINE WITHOUT CONTRAST TECHNIQUE: Multidetector CT imaging of the head and cervical spine was performed following the standard protocol without intravenous contrast. Multiplanar CT image reconstructions of the cervical spine were also generated. COMPARISON:  PET-CT 03/12/2017 cervical spine CT 03/03/2016. FINDINGS: CT HEAD FINDINGS Brain: There is no evidence for acute hemorrhage,  hydrocephalus, mass lesion, or abnormal extra-axial fluid collection. No definite CT evidence for acute infarction. Diffuse loss of parenchymal volume is consistent with atrophy. Patchy low attenuation in the deep hemispheric and periventricular white matter is nonspecific, but likely reflects chronic microvascular ischemic demyelination. Vascular: Atherosclerotic calcification of the carotid siphons noted at the skull base. No dense MCA sign. Skull: No evidence for fracture. No worrisome lytic or sclerotic lesion. Sinuses/Orbits: Opacification of right mastoid air cells is stable. Left mastoid air cells clear. No evidence for middle ear fluid. Paranasal sinuses clear. Visualized portions of the globes and intraorbital fat are unremarkable. Other: None. CT CERVICAL SPINE FINDINGS Alignment: Straightening of normal cervical lordosis with reversal of lordosis in the lower cervical levels, stable. Trace anterolisthesis of C3 on 4 and C4 on 5 is unchanged and compatible with the marked degree  of facet degeneration at the same levels. Loss of intervertebral disc height noted at C5-6 and C6-7. Skull base and vertebrae: No acute fracture. No primary bone lesion or focal pathologic process. Soft tissues and spinal canal: No prevertebral fluid or swelling. No visible canal hematoma. Disc levels:  See above. Upper chest: Biapical pleuroparenchymal scarring. Other: None. IMPRESSION: 1. Stable CT head. Atrophy with chronic small vessel white matter ischemic disease. No acute intracranial abnormality. 2. Stable cervical spine CT. Degenerative changes without acute bony abnormality. Electronically Signed   By: Misty Stanley M.D.   On: 03/26/2017 11:23   Ct Cervical Spine Wo Contrast  Result Date: 03/26/2017 CLINICAL DATA:  Unwitnessed fall.  Right temporal injury. EXAM: CT HEAD WITHOUT CONTRAST CT CERVICAL SPINE WITHOUT CONTRAST TECHNIQUE: Multidetector CT imaging of the head and cervical spine was performed following the  standard protocol without intravenous contrast. Multiplanar CT image reconstructions of the cervical spine were also generated. COMPARISON:  PET-CT 03/12/2017 cervical spine CT 03/03/2016. FINDINGS: CT HEAD FINDINGS Brain: There is no evidence for acute hemorrhage, hydrocephalus, mass lesion, or abnormal extra-axial fluid collection. No definite CT evidence for acute infarction. Diffuse loss of parenchymal volume is consistent with atrophy. Patchy low attenuation in the deep hemispheric and periventricular white matter is nonspecific, but likely reflects chronic microvascular ischemic demyelination. Vascular: Atherosclerotic calcification of the carotid siphons noted at the skull base. No dense MCA sign. Skull: No evidence for fracture. No worrisome lytic or sclerotic lesion. Sinuses/Orbits: Opacification of right mastoid air cells is stable. Left mastoid air cells clear. No evidence for middle ear fluid. Paranasal sinuses clear. Visualized portions of the globes and intraorbital fat are unremarkable. Other: None. CT CERVICAL SPINE FINDINGS Alignment: Straightening of normal cervical lordosis with reversal of lordosis in the lower cervical levels, stable. Trace anterolisthesis of C3 on 4 and C4 on 5 is unchanged and compatible with the marked degree of facet degeneration at the same levels. Loss of intervertebral disc height noted at C5-6 and C6-7. Skull base and vertebrae: No acute fracture. No primary bone lesion or focal pathologic process. Soft tissues and spinal canal: No prevertebral fluid or swelling. No visible canal hematoma. Disc levels:  See above. Upper chest: Biapical pleuroparenchymal scarring. Other: None. IMPRESSION: 1. Stable CT head. Atrophy with chronic small vessel white matter ischemic disease. No acute intracranial abnormality. 2. Stable cervical spine CT. Degenerative changes without acute bony abnormality. Electronically Signed   By: Misty Stanley M.D.   On: 03/26/2017 11:23     Procedures .Marland KitchenLaceration Repair Date/Time: 03/26/2017 11:46 AM Performed by: Valarie Merino, MD Authorized by: Valarie Merino, MD   Consent:    Consent obtained:  Verbal   Consent given by:  Patient   Risks discussed:  Infection, need for additional repair, pain, poor cosmetic result, retained foreign body and vascular damage   Alternatives discussed:  No treatment Anesthesia (see MAR for exact dosages):    Anesthesia method:  None Laceration details:    Location:  Scalp   Scalp location:  Frontal   Length (cm):  1.5 Repair type:    Repair type:  Simple Pre-procedure details:    Preparation:  Patient was prepped and draped in usual sterile fashion Exploration:    Hemostasis achieved with:  Direct pressure   Contaminated: no   Treatment:    Area cleansed with:  Saline   Amount of cleaning:  Standard   Irrigation solution:  Sterile saline   Visualized foreign bodies/material removed: no  Skin repair:    Repair method:  Tissue adhesive Approximation:    Approximation:  Close   Vermilion border: well-aligned   Post-procedure details:    Patient tolerance of procedure:  Tolerated well, no immediate complications    (including critical care time)  Medications Ordered in ED Medications  tetanus & diphtheria toxoids (adult) (TENIVAC) injection 0.5 mL (not administered)     Initial Impression / Assessment and Plan / ED Course  I have reviewed the triage vital signs and the nursing notes.  Pertinent labs & imaging results that were available during my care of the patient were reviewed by me and considered in my medical decision making (see chart for details).     MDM  Screen complete  Patient is presenting for wound repair following a minor head trauma secondary to mechanical fall.  Patient without any other significant injury.  CT imaging of the head and C-spine do not reveal significant acute traumatic pathology.  She has had her baseline mental status.   Close follow-up is discussed with the patient's family - her brother is at bedside.  Strict return precautions given and understood.  Final Clinical Impressions(s) / ED Diagnoses   Final diagnoses:  Injury of head, initial encounter  Laceration of head without foreign body, unspecified part of head, initial encounter    ED Discharge Orders    None       Valarie Merino, MD 03/26/17 1149

## 2017-03-26 NOTE — ED Notes (Signed)
Tetanus medication did not scan. Called pharmacy and medication returned for verification.

## 2017-03-26 NOTE — ED Notes (Signed)
Pt returns from xray. ptt transported too ct scan.

## 2017-03-26 NOTE — ED Triage Notes (Signed)
Patient arrive via EMS from memory unit at [facility]. Patient is AAO x1. Patient has head lac above left eye and torn skin on right knee area.

## 2017-03-26 NOTE — ED Notes (Signed)
Patient returned from XRay

## 2017-07-15 ENCOUNTER — Emergency Department (HOSPITAL_COMMUNITY): Payer: Medicare Other

## 2017-07-15 ENCOUNTER — Emergency Department (HOSPITAL_COMMUNITY)
Admission: EM | Admit: 2017-07-15 | Discharge: 2017-07-15 | Disposition: A | Discharge status: Skilled Nursing Facility | Payer: Medicare Other | Attending: Emergency Medicine | Admitting: Emergency Medicine

## 2017-07-15 DIAGNOSIS — F0390 Unspecified dementia without behavioral disturbance: Secondary | ICD-10-CM | POA: Diagnosis not present

## 2017-07-15 DIAGNOSIS — Y999 Unspecified external cause status: Secondary | ICD-10-CM | POA: Diagnosis not present

## 2017-07-15 DIAGNOSIS — Z7982 Long term (current) use of aspirin: Secondary | ICD-10-CM | POA: Insufficient documentation

## 2017-07-15 DIAGNOSIS — Y9389 Activity, other specified: Secondary | ICD-10-CM | POA: Diagnosis not present

## 2017-07-15 DIAGNOSIS — S0990XA Unspecified injury of head, initial encounter: Secondary | ICD-10-CM | POA: Diagnosis present

## 2017-07-15 DIAGNOSIS — S0181XA Laceration without foreign body of other part of head, initial encounter: Secondary | ICD-10-CM | POA: Diagnosis not present

## 2017-07-15 DIAGNOSIS — Z79899 Other long term (current) drug therapy: Secondary | ICD-10-CM | POA: Insufficient documentation

## 2017-07-15 DIAGNOSIS — S52501A Unspecified fracture of the lower end of right radius, initial encounter for closed fracture: Secondary | ICD-10-CM | POA: Diagnosis not present

## 2017-07-15 DIAGNOSIS — Y92122 Bedroom in nursing home as the place of occurrence of the external cause: Secondary | ICD-10-CM | POA: Diagnosis not present

## 2017-07-15 DIAGNOSIS — X58XXXA Exposure to other specified factors, initial encounter: Secondary | ICD-10-CM | POA: Insufficient documentation

## 2017-07-15 LAB — CBG MONITORING, ED: Glucose-Capillary: 79 mg/dL (ref 65–99)

## 2017-07-15 MED ORDER — LIDOCAINE-EPINEPHRINE 1 %-1:100000 IJ SOLN
10.0000 mL | Freq: Once | INTRAMUSCULAR | Status: AC
  Administered 2017-07-15: 10 mL
  Filled 2017-07-15: qty 10

## 2017-07-15 NOTE — ED Notes (Signed)
Report called to Margette Fast, med tech at [facility].

## 2017-07-15 NOTE — Discharge Instructions (Signed)
Suture removal in 5-7 days. Follow-up with hand surgery this upcoming week.

## 2017-07-15 NOTE — ED Triage Notes (Signed)
Patient BIB [facility] from [facility], for unwitnessed fall. Pt sustained laceration to rt side of forehead, bleeding controled at this time. Pt oriented to self only. Does not remember falling. Pt found by staff lying in bed with laceration.

## 2017-07-15 NOTE — ED Provider Notes (Signed)
[facility] DEPT Provider Note   CSN: [id number] Arrival date & time: 07/15/17  0973     History   Chief Complaint Chief Complaint  Patient presents with  . Fall    HPI Amanda Randall is a 84 y.o. female.  HPI   84 year old female presenting after presumed fall.  She is found this morning by staff at her facility with a laceration to her head.  She is found laying in bed.  She is unable to tell me how she sustained this wound.  She is complaining of some pain at the site of the forehead laceration.  She denies any significant pain elsewhere.  Past Medical History:  Diagnosis Date  . Dementia     There are no active problems to display for this patient.   No past surgical history on file.   OB History   None      Home Medications    Prior to Admission medications   Medication Sig Start Date End Date Taking? Authorizing Provider  aspirin 81 MG chewable tablet Chew 81 mg by mouth daily.    [provider]  citalopram (CELEXA) 20 MG tablet Take 20 mg by mouth daily.    [provider]  cyanocobalamin 1000 MCG tablet Take 1,000 mcg by mouth daily.    [provider]  diazepam (VALIUM) 2 MG tablet Take 2 mg by mouth 2 (two) times daily.    [provider]  ENSURE (ENSURE) Take 1 Can by mouth 2 (two) times daily between meals.    [provider]  ferrous sulfate 325 (65 FE) MG tablet Take 325 mg by mouth daily with breakfast.    [provider]  lisinopril (PRINIVIL,ZESTRIL) 20 MG tablet Take 20 mg by mouth daily.    [provider]  memantine (NAMENDA) 10 MG tablet Take 10 mg by mouth 2 (two) times daily.    [provider]  metoprolol tartrate (LOPRESSOR) 25 MG tablet Take 25 mg by mouth 2 (two) times daily.    [provider]  mirtazapine (REMERON) 7.5 MG tablet Take 7.5 mg by mouth at bedtime.    [provider]  pregabalin (LYRICA) 50 MG capsule Take 50  mg by mouth daily.    [provider]  rivastigmine (EXELON) 3 MG capsule Take 3 mg by mouth 2 (two) times daily.    [provider]    Family History No family history on file.  Social History Social History   Tobacco Use  . Smoking status: Never Smoker  . Smokeless tobacco: Never Used  Substance Use Topics  . Alcohol use: No  . Drug use: No     Allergies   Patient has no known allergies.   Review of Systems Review of Systems  All systems reviewed and negative, other than as noted in HPI.  Physical Exam Updated Vital Signs BP 121/70 (BP Location: Right Arm)   Pulse 78   Temp 98.5 F (36.9 C) (Oral)   Resp 14   SpO2 96%   Physical Exam  Constitutional: She appears well-developed and well-nourished. No distress.  HENT:  Head: Normocephalic.  Complex 4 cm macerated laceration to the right forehead.  Minimal bleeding.  Eyes: Conjunctivae are normal. Right eye exhibits no discharge. Left eye exhibits no discharge.  Neck: Neck supple.  Cardiovascular: Normal rate, regular rhythm and normal heart sounds. Exam reveals no gallop and no friction rub.  No murmur heard. Pulmonary/Chest: Effort normal and breath  sounds normal. No respiratory distress.  Abdominal: Soft. She exhibits no distension. There is no tenderness.  Musculoskeletal: She exhibits no edema or tenderness.  No midline spinal tenderness.  Is able to sit up in bed without any apparent difficulty.  No apparent pain with range of motion of the large joints aside from the right wrist.  She does have some mild tenderness over the distal radius.  There is no swelling or deformity noted though.  She is neurovascularly intact.  Neurological: She is alert. No cranial nerve deficit.  Moves all extremities equally  Skin: Skin is warm and dry.  Psychiatric: She has a normal mood and affect. Her behavior is normal. Thought content normal.  Nursing note and vitals reviewed.    ED Treatments / Results   Labs (all labs ordered are listed, but only abnormal results are displayed) Labs Reviewed  CBG MONITORING, ED    EKG EKG Interpretation  Date/Time:  Friday July 15 2017 09:04:44 EDT Ventricular Rate:  63 PR Interval:    QRS Duration: 125 QT Interval:  439 QTC Calculation: 450 R Axis:   -52 Text Interpretation:  Sinus rhythm RBBB and LAFB Non-specific ST-t changes Confirmed by Virgel Manifold [phone]) on 07/15/2017 9:15:38 AM   Radiology Dg Wrist Complete Right  Result Date: 07/15/2017 CLINICAL DATA:  Unwitnessed fall this morning with wrist pain, initial encounter EXAM: RIGHT WRIST - COMPLETE 3+ VIEW COMPARISON:  None. FINDINGS: Degenerative changes are noted in the interphalangeal joints as well as in the radiocarpal joint. Some mild widening of the scapholunate articulation is noted which may be related to ligamentous injury. This may be chronic in nature. There is an undisplaced distal radial fracture identified within the metaphysis. This does not appear to extend to the articular surface. No ulnar abnormality is noted. IMPRESSION: Undisplaced distal radial fracture. Electronically Signed   By: Inez Catalina M.D.   On: 07/15/2017 09:39   Ct Head Wo Contrast  Result Date: 07/15/2017 CLINICAL DATA:  Forehead laceration after unwitnessed fall. EXAM: CT HEAD WITHOUT CONTRAST TECHNIQUE: Contiguous axial images were obtained from the base of the skull through the vertex without intravenous contrast. COMPARISON:  CT scan of March 26, 2017. FINDINGS: Brain: Mild chronic ischemic white matter disease is noted. No mass effect or midline shift is noted. Ventricular size is within normal limits. There is no evidence of mass lesion, hemorrhage or acute infarction. Vascular: No hyperdense vessel or unexpected calcification. Skull: Normal. Negative for fracture or focal lesion. Sinuses/Orbits: No acute finding. Other: Fluid is noted in right mastoid air cells. IMPRESSION: Mild chronic ischemic white  matter disease. No acute intracranial abnormality seen. Electronically Signed   By: Marijo Conception, M.D.   On: 07/15/2017 09:50    Procedures Procedures (including critical care time)  LACERATION REPAIR Performed by: Virgel Manifold Authorized by: Virgel Manifold Consent: Verbal consent obtained. Risks and benefits: risks, benefits and alternatives were discussed Consent given by: patient Patient identity confirmed: provided demographic data Prepped and Draped in normal sterile fashion Wound explored  Laceration Location: r forehead  Laceration Length: 4 cm  No Foreign Bodies seen or palpated  Anesthesia: local infiltration  Local anesthetic: lidocaine 1% w epinephrine  Anesthetic total: 3 ml  Irrigation method: syringe Amount of cleaning: standard  Skin closure: 5-0 nylon  Technique: simple interrupted  Patient tolerance: Patient tolerated the procedure well with no immediate complications.   Medications Ordered in ED Medications  lidocaine-EPINEPHrine (XYLOCAINE W/EPI) 1 %-1:100000 (with pres) injection 10 mL (  has no administration in time range)     Initial Impression / Assessment and Plan / ED Course  I have reviewed the triage vital signs and the nursing notes.  Pertinent labs & imaging results that were available during my care of the patient were reviewed by me and considered in my medical decision making (see chart for details).     Forehead laceration closed. Imaging reports reviewed. R radius fx. NVI. Splinted. Hand surgery FU.   Final Clinical Impressions(s) / ED Diagnoses   Final diagnoses:  Facial laceration, initial encounter  Closed fracture of distal end of right radius, unspecified fracture morphology, initial encounter    ED Discharge Orders    None       Virgel Manifold, MD 07/15/17 1124

## 2017-07-15 NOTE — ED Notes (Signed)
Bed: UY40 Expected date:  Expected time:  Means of arrival:  Comments: EMS- 84 yo unwitnessed fall, head lac

## 2017-12-13 ENCOUNTER — Emergency Department (HOSPITAL_COMMUNITY)
Admission: EM | Admit: 2017-12-13 | Discharge: 2017-12-13 | Disposition: A | Discharge status: Home / Self Care | Payer: Medicare Other | Attending: Emergency Medicine | Admitting: Emergency Medicine

## 2017-12-13 ENCOUNTER — Emergency Department (HOSPITAL_COMMUNITY): Payer: Medicare Other

## 2017-12-13 ENCOUNTER — Encounter (HOSPITAL_COMMUNITY): Payer: Self-pay | Admitting: Emergency Medicine

## 2017-12-13 DIAGNOSIS — S0101XA Laceration without foreign body of scalp, initial encounter: Secondary | ICD-10-CM | POA: Diagnosis not present

## 2017-12-13 DIAGNOSIS — W19XXXA Unspecified fall, initial encounter: Secondary | ICD-10-CM | POA: Insufficient documentation

## 2017-12-13 DIAGNOSIS — Y939 Activity, unspecified: Secondary | ICD-10-CM | POA: Insufficient documentation

## 2017-12-13 DIAGNOSIS — Y999 Unspecified external cause status: Secondary | ICD-10-CM | POA: Insufficient documentation

## 2017-12-13 DIAGNOSIS — Z7982 Long term (current) use of aspirin: Secondary | ICD-10-CM | POA: Insufficient documentation

## 2017-12-13 DIAGNOSIS — I10 Essential (primary) hypertension: Secondary | ICD-10-CM | POA: Diagnosis not present

## 2017-12-13 DIAGNOSIS — S0990XA Unspecified injury of head, initial encounter: Secondary | ICD-10-CM | POA: Diagnosis present

## 2017-12-13 DIAGNOSIS — F0390 Unspecified dementia without behavioral disturbance: Secondary | ICD-10-CM | POA: Diagnosis not present

## 2017-12-13 DIAGNOSIS — Z79899 Other long term (current) drug therapy: Secondary | ICD-10-CM | POA: Insufficient documentation

## 2017-12-13 DIAGNOSIS — Y92129 Unspecified place in nursing home as the place of occurrence of the external cause: Secondary | ICD-10-CM | POA: Insufficient documentation

## 2017-12-13 HISTORY — DX: Essential (primary) hypertension: I10

## 2017-12-13 NOTE — ED Notes (Signed)
Patient verbalizes understanding of discharge instructions. Opportunity for questioning and answers were provided. Armband removed by staff, pt discharged from ED.  

## 2017-12-13 NOTE — ED Provider Notes (Signed)
[facility] EMERGENCY DEPARTMENT Provider Note   CSN: [id number] Arrival date & time: 12/13/17  9563     History   Chief Complaint Chief Complaint  Patient presents with  . Fall    HPI LACHANDRA DETTMANN is a 84 y.o. female.  Patient sent in from [facility] facility.  Apparently found on floor.  Bleeding from the occiput part of the scalp.  Patient has a history of frequent falls.  The fall was unwitnessed.  Patient apparently at baseline mental status alert and will follow commands.     Past Medical History:  Diagnosis Date  . Dementia ([place])   . Hypertension     There are no active problems to display for this patient.   History reviewed. No pertinent surgical history.   OB History   None      Home Medications    Prior to Admission medications   Medication Sig Start Date End Date Taking? Authorizing Provider  aspirin 81 MG chewable tablet Chew 81 mg by mouth daily.    [provider]  chlorhexidine (PERIDEX) 0.12 % solution Use as directed 30 mLs in the mouth or throat 2 (two) times daily.    [provider]  citalopram (CELEXA) 20 MG tablet Take 20 mg by mouth daily.    [provider]  diazepam (VALIUM) 2 MG tablet Take 2 mg by mouth 2 (two) times daily.    [provider]  ENSURE (ENSURE) Take 1 Can by mouth 2 (two) times daily between meals.    [provider]  ferrous sulfate 325 (65 FE) MG tablet Take 325 mg by mouth daily with breakfast.    [provider]  lisinopril (PRINIVIL,ZESTRIL) 20 MG tablet Take 20 mg by mouth daily.    [provider]  memantine (NAMENDA) 10 MG tablet Take 10 mg by mouth 2 (two) times daily.    [provider]  metoprolol tartrate (LOPRESSOR) 25 MG tablet Take 12.5 mg by mouth 2 (two) times daily.     [provider]  mirtazapine (REMERON) 7.5 MG tablet Take 7.5 mg by mouth at bedtime.    [provider]    polyethylene glycol (MIRALAX / GLYCOLAX) packet Take 17 g by mouth daily.    [provider]  pregabalin (LYRICA) 50 MG capsule Take 50 mg by mouth daily.    [provider]  rivastigmine (EXELON) 3 MG capsule Take 3 mg by mouth 2 (two) times daily.    [provider]  vitamin B-12 (CYANOCOBALAMIN) 1000 MCG tablet Take 1,000 mcg by mouth daily.    [provider]    Family History No family history on file.  Social History Social History   Tobacco Use  . Smoking status: Never Smoker  . Smokeless tobacco: Never Used  Substance Use Topics  . Alcohol use: No  . Drug use: No     Allergies   Patient has no known allergies.   Review of Systems Review of Systems  Unable to perform ROS: Dementia     Physical Exam Updated Vital Signs BP (!) 173/93   Pulse 70   Temp 97.9 F (36.6 C) (Oral)   Resp 18   SpO2 100%   Physical Exam  Constitutional: She appears well-developed and well-nourished. No distress.  HENT:  Mouth/Throat: Oropharynx is clear and moist.  Occiput area with 4 cm scalp laceration.  Eyes: Pupils are equal, round, and reactive to light. Conjunctivae and EOM  are normal.  Neck:  C-collar in place.  Cardiovascular: Normal rate and regular rhythm.  Pulmonary/Chest: Effort normal and breath sounds normal. No respiratory distress.  Abdominal: Soft. Bowel sounds are normal. There is no tenderness.  Musculoskeletal: Normal range of motion. She exhibits no edema or deformity.  Patient with excellent range of motion of both lower extremities no obvious hip problem.  Neurological: She is alert. No cranial nerve deficit or sensory deficit. She exhibits normal muscle tone.  Patient with significant memory deficit.  Will follow all commands moving all extremities.  Skin: Skin is warm.  Nursing note and vitals reviewed.    ED Treatments / Results  Labs (all labs ordered are listed, but only abnormal results are displayed) Labs  Reviewed - No data to display  EKG None  Radiology Ct Head Wo Contrast  Result Date: 12/13/2017 CLINICAL DATA:  84 year old female with dementia fell hitting head. No reported loss of consciousness. Initial encounter. EXAM: CT HEAD WITHOUT CONTRAST CT CERVICAL SPINE WITHOUT CONTRAST TECHNIQUE: Multidetector CT imaging of the head and cervical spine was performed following the standard protocol without intravenous contrast. Multiplanar CT image reconstructions of the cervical spine were also generated. COMPARISON:  07/15/2017 head CT. 03/26/2017 head CT and cervical spine CT. FINDINGS: CT HEAD FINDINGS Brain: No intracranial hemorrhage or CT evidence of large acute infarct. Chronic microvascular changes. Global atrophy. No intracranial mass lesion noted on this unenhanced exam. Vascular: Vascular calcifications Skull: No skull fracture Sinuses/Orbits: Post lens replacement. No acute orbital abnormality. Partial opacification small left sphenoid sinus. Other: Opacification right mastoid air cells. No obstructing lesion of eustachian tube. Right parietal scalp hematoma. CT CERVICAL SPINE FINDINGS Alignment: Mild anterior slip C3 and C4 similar to prior exam. Skull base and vertebrae: No cervical spine fracture. Prominent cervical spondylotic changes C5-6 and C6-7 with surrounding sclerosis. Soft tissues and spinal canal: No abnormal prevertebral soft tissue swelling. Cervical spondylotic changes with various degrees of spinal stenosis and foraminal narrowing without significant cord compression. Disc levels: Transverse ligament hypertrophy with mild narrowing ventral thecal sac. Facet degenerative changes C3-4 with minimal anterior slip C3 minimal bulge. Fused left C4-5 facet joint. Minimal anterior slip C4. Minimal bulge. C5-6 prominent discogenic changes and left facet degenerative changes with mild bulge and ventral thecal sac narrowing. C6-7 prominent discogenic changes. Mild bulge. Mild narrowing ventral  thecal sac. Upper chest: Scarring lung apices. Other: Carotid bifurcation calcifications. Minimal nodularity thyroid gland without dominant mass. IMPRESSION: Head CT without contrast: 1. Right parietal scalp hematoma. No underlying skull fracture or intracranial hemorrhage. 2. Chronic microvascular changes.  Atrophy. 3. Opacification right mastoid air cells without obstructing lesion of eustachian tube noted. Cervical spine CT: 1. Alignment similar to prior exam. No cervical spine fracture or abnormal prevertebral soft tissue swelling noted. 2. Multilevel cervical spondylotic changes similar to prior exam without high-grade spinal stenosis. Electronically Signed   By: Genia Del M.D.   On: 12/13/2017 08:49   Ct Cervical Spine Wo Contrast  Result Date: 12/13/2017 CLINICAL DATA:  84 year old female with dementia fell hitting head. No reported loss of consciousness. Initial encounter. EXAM: CT HEAD WITHOUT CONTRAST CT CERVICAL SPINE WITHOUT CONTRAST TECHNIQUE: Multidetector CT imaging of the head and cervical spine was performed following the standard protocol without intravenous contrast. Multiplanar CT image reconstructions of the cervical spine were also generated. COMPARISON:  07/15/2017 head CT. 03/26/2017 head CT and cervical spine CT. FINDINGS: CT HEAD FINDINGS Brain: No intracranial hemorrhage or CT evidence of large acute infarct. Chronic microvascular  changes. Global atrophy. No intracranial mass lesion noted on this unenhanced exam. Vascular: Vascular calcifications Skull: No skull fracture Sinuses/Orbits: Post lens replacement. No acute orbital abnormality. Partial opacification small left sphenoid sinus. Other: Opacification right mastoid air cells. No obstructing lesion of eustachian tube. Right parietal scalp hematoma. CT CERVICAL SPINE FINDINGS Alignment: Mild anterior slip C3 and C4 similar to prior exam. Skull base and vertebrae: No cervical spine fracture. Prominent cervical spondylotic  changes C5-6 and C6-7 with surrounding sclerosis. Soft tissues and spinal canal: No abnormal prevertebral soft tissue swelling. Cervical spondylotic changes with various degrees of spinal stenosis and foraminal narrowing without significant cord compression. Disc levels: Transverse ligament hypertrophy with mild narrowing ventral thecal sac. Facet degenerative changes C3-4 with minimal anterior slip C3 minimal bulge. Fused left C4-5 facet joint. Minimal anterior slip C4. Minimal bulge. C5-6 prominent discogenic changes and left facet degenerative changes with mild bulge and ventral thecal sac narrowing. C6-7 prominent discogenic changes. Mild bulge. Mild narrowing ventral thecal sac. Upper chest: Scarring lung apices. Other: Carotid bifurcation calcifications. Minimal nodularity thyroid gland without dominant mass. IMPRESSION: Head CT without contrast: 1. Right parietal scalp hematoma. No underlying skull fracture or intracranial hemorrhage. 2. Chronic microvascular changes.  Atrophy. 3. Opacification right mastoid air cells without obstructing lesion of eustachian tube noted. Cervical spine CT: 1. Alignment similar to prior exam. No cervical spine fracture or abnormal prevertebral soft tissue swelling noted. 2. Multilevel cervical spondylotic changes similar to prior exam without high-grade spinal stenosis. Electronically Signed   By: Genia Del M.D.   On: 12/13/2017 08:49   Dg Humerus Right  Result Date: 12/13/2017 CLINICAL DATA:  Right arm pain after fall. EXAM: RIGHT HUMERUS - 2+ VIEW COMPARISON:  None. FINDINGS: There is no evidence of fracture or other focal bone lesions. Soft tissues are unremarkable. IMPRESSION: Negative. Electronically Signed   By: Marijo Conception, M.D.   On: 12/13/2017 08:39    Procedures Procedures (including critical care time)  Medications Ordered in ED Medications - No data to display   Initial Impression / Assessment and Plan / ED Course  I have reviewed the triage  vital signs and the nursing notes.  Pertinent labs & imaging results that were available during my care of the patient were reviewed by me and considered in my medical decision making (see chart for details).     Patient from [facility] facility.  Patient with unwitnessed fall was found on the floor had some bleeding from a scalp laceration which measured about 4 cm.  This repaired with surgical staples.  Unknown whether there was loss of consciousness.  Patient has a history of frequent falls.  She is not on any blood thinners.  Patient had CT head neck both were negative.  Patient has good range of motion of both of her legs without any problems.  Is evidence of old bruising to the right proximal humerus area and has an old skin tear to the right forearm.  The the humerus was x-rayed without any acute findings.  Patient still complaining of pain there.  No bony injury.  This can be followed up further in the nursing home.  Patient stable for discharge back to nursing facility.  Final Clinical Impressions(s) / ED Diagnoses   Final diagnoses:  Fall, initial encounter  Minor head injury, initial encounter  Laceration of scalp, initial encounter    ED Discharge Orders    None       Fredia Sorrow, MD 12/13/17 1049

## 2017-12-13 NOTE — Discharge Instructions (Addendum)
Work-up here today in the emergency department included CT of head and neck without any acute findings.  Also x-rayed her right upper arm since there was old bruising there are no evidence of any bony injury there.  Patient had approximately 4 cm scalp laceration which was closed with surgical staples.  Keep this area dry for 24 hours.  Then she can have her hair washed.  Remove the surgical staples and 7 days.  Return for any new or worse symptoms.

## 2017-12-13 NOTE — ED Triage Notes (Addendum)
Patient fell while making her bed and hit her head. She has a laceration to back of head.  The fall was unwitnessed, she states she did not have any LOC but does have history of dementia.  Patient is CAO to baseline.  Right shoulder pain.

## 2017-12-13 NOTE — ED Notes (Signed)
Laceration on back of head stapled by MD Rogene Houston

## 2018-02-15 ENCOUNTER — Encounter (HOSPITAL_COMMUNITY): Payer: Self-pay | Admitting: Emergency Medicine

## 2018-02-15 ENCOUNTER — Emergency Department (HOSPITAL_COMMUNITY)
Admission: EM | Admit: 2018-02-15 | Discharge: 2018-02-15 | Disposition: A | Discharge status: Home / Self Care | Payer: Medicare Other | Attending: Emergency Medicine | Admitting: Emergency Medicine

## 2018-02-15 ENCOUNTER — Other Ambulatory Visit: Payer: Self-pay

## 2018-02-15 ENCOUNTER — Emergency Department (HOSPITAL_COMMUNITY): Payer: Medicare Other

## 2018-02-15 DIAGNOSIS — Z7982 Long term (current) use of aspirin: Secondary | ICD-10-CM | POA: Diagnosis not present

## 2018-02-15 DIAGNOSIS — S0083XA Contusion of other part of head, initial encounter: Secondary | ICD-10-CM | POA: Insufficient documentation

## 2018-02-15 DIAGNOSIS — Y999 Unspecified external cause status: Secondary | ICD-10-CM | POA: Diagnosis not present

## 2018-02-15 DIAGNOSIS — Y929 Unspecified place or not applicable: Secondary | ICD-10-CM | POA: Insufficient documentation

## 2018-02-15 DIAGNOSIS — F0390 Unspecified dementia without behavioral disturbance: Secondary | ICD-10-CM | POA: Insufficient documentation

## 2018-02-15 DIAGNOSIS — I10 Essential (primary) hypertension: Secondary | ICD-10-CM | POA: Diagnosis not present

## 2018-02-15 DIAGNOSIS — Z79899 Other long term (current) drug therapy: Secondary | ICD-10-CM | POA: Diagnosis not present

## 2018-02-15 DIAGNOSIS — Y939 Activity, unspecified: Secondary | ICD-10-CM | POA: Insufficient documentation

## 2018-02-15 DIAGNOSIS — W19XXXA Unspecified fall, initial encounter: Secondary | ICD-10-CM | POA: Diagnosis not present

## 2018-02-15 DIAGNOSIS — S0990XA Unspecified injury of head, initial encounter: Secondary | ICD-10-CM | POA: Diagnosis present

## 2018-02-15 NOTE — Discharge Instructions (Signed)
CT scan of her head and neck unremarkable.  Chest x-ray and pelvic x-ray showed no injuries.  Please have her return to the ED if she develops any other symptoms.  Does not appear to have any extremity injuries on exam.  Continue fall precautions.

## 2018-02-15 NOTE — ED Notes (Signed)
Patient transported to CT 

## 2018-02-15 NOTE — ED Notes (Signed)
[facility] arrived for transport 

## 2018-02-15 NOTE — ED Triage Notes (Signed)
Per EMS: Unwitnessed fall from [facility].  Staff found her sitting on the floor beside her bed.  Pt has dementia and doesn't remember what happened.  C/o rt sided temporal pain.  No obvious signs of injury.

## 2018-02-15 NOTE — ED Notes (Signed)
[facility] called for transport.  

## 2018-02-15 NOTE — ED Provider Notes (Signed)
[facility] DEPT Provider Note   CSN: [id number] Arrival date & time: 02/15/18  2440     History   Chief Complaint Chief Complaint  Patient presents with  . Fall    HPI Amanda Randall is a 85 y.o. female.  The history is provided by the patient and the EMS personnel.  Fall  This is a new problem. The current episode started 3 to 5 hours ago. The problem has been resolved. Associated symptoms include headaches. Pertinent negatives include no chest pain, no abdominal pain and no shortness of breath. Nothing aggravates the symptoms. Nothing relieves the symptoms. She has tried nothing for the symptoms. The treatment provided no relief.    Past Medical History:  Diagnosis Date  . Dementia ([place])   . Hypertension     There are no active problems to display for this patient.   History reviewed. No pertinent surgical history.   OB History   No obstetric history on file.      Home Medications    Prior to Admission medications   Medication Sig Start Date End Date Taking? Authorizing Provider  aspirin 81 MG chewable tablet Chew 81 mg by mouth daily.    [provider]  chlorhexidine (PERIDEX) 0.12 % solution Use as directed 30 mLs in the mouth or throat 2 (two) times daily.    [provider]  citalopram (CELEXA) 20 MG tablet Take 20 mg by mouth daily.    [provider]  diazepam (VALIUM) 2 MG tablet Take 2 mg by mouth 2 (two) times daily.    [provider]  ENSURE (ENSURE) Take 1 Can by mouth 2 (two) times daily between meals.    [provider]  ferrous sulfate 325 (65 FE) MG tablet Take 325 mg by mouth daily with breakfast.    [provider]  lisinopril (PRINIVIL,ZESTRIL) 20 MG tablet Take 20 mg by mouth daily.    [provider]  memantine (NAMENDA) 10 MG tablet Take 10 mg by mouth 2 (two) times daily.    [provider]  metoprolol tartrate (LOPRESSOR) 25 MG tablet Take  12.5 mg by mouth 2 (two) times daily.     [provider]  mirtazapine (REMERON) 7.5 MG tablet Take 7.5 mg by mouth at bedtime.    [provider]  polyethylene glycol (MIRALAX / GLYCOLAX) packet Take 17 g by mouth daily.    [provider]  pregabalin (LYRICA) 50 MG capsule Take 50 mg by mouth daily.    [provider]  rivastigmine (EXELON) 3 MG capsule Take 3 mg by mouth 2 (two) times daily.    [provider]  vitamin B-12 (CYANOCOBALAMIN) 1000 MCG tablet Take 1,000 mcg by mouth daily.    [provider]    Family History No family history on file.  Social History Social History   Tobacco Use  . Smoking status: Never Smoker  . Smokeless tobacco: Never Used  Substance Use Topics  . Alcohol use: No  . Drug use: No     Allergies   Patient has no known allergies.   Review of Systems Review of Systems  Constitutional: Negative for chills and fever.  HENT: Negative for ear pain and sore throat.   Eyes: Negative for pain and visual disturbance.  Respiratory: Negative for cough and shortness of breath.   Cardiovascular: Negative for chest pain and palpitations.  Gastrointestinal: Negative for abdominal pain and vomiting.  Genitourinary: Negative for dysuria  and hematuria.  Musculoskeletal: Negative for arthralgias and back pain.  Skin: Negative for color change and rash.  Neurological: Positive for headaches. Negative for seizures and syncope.  All other systems reviewed and are negative.    Physical Exam Updated Vital Signs  ED Triage Vitals [02/15/18 0626]  Enc Vitals Group     BP 123/63     Pulse Rate (!) 58     Resp 15     Temp (!) 97 F (36.1 C)     Temp Source Axillary     SpO2 95 %     Weight      Height      Head Circumference      Peak Flow      Pain Score      Pain Loc      Pain Edu?      Excl. in [place]?     Physical Exam Vitals signs and nursing note reviewed.  Constitutional:      General:  She is not in acute distress.    Appearance: She is well-developed.  HENT:     Head:     Comments: Mild hematoma over upper forehead    Right Ear: External ear normal.     Left Ear: External ear normal.     Nose: Nose normal.     Mouth/Throat:     Mouth: Mucous membranes are moist.     Pharynx: No oropharyngeal exudate.  Eyes:     Extraocular Movements: Extraocular movements intact.     Conjunctiva/sclera: Conjunctivae normal.     Pupils: Pupils are equal, round, and reactive to light.  Neck:     Musculoskeletal: Normal range of motion and neck supple.  Cardiovascular:     Rate and Rhythm: Normal rate and regular rhythm.     Pulses: Normal pulses.     Heart sounds: Normal heart sounds. No murmur.  Pulmonary:     Effort: Pulmonary effort is normal. No respiratory distress.     Breath sounds: Normal breath sounds.  Abdominal:     Palpations: Abdomen is soft.     Tenderness: There is no abdominal tenderness.  Skin:    General: Skin is warm and dry.     Capillary Refill: Capillary refill takes less than 2 seconds.  Neurological:     General: No focal deficit present.     Mental Status: She is alert.     Cranial Nerves: No cranial nerve deficit.     Sensory: No sensory deficit.     Motor: No weakness.     Coordination: Coordination normal.     Comments: Grossly normal strength and sensation throughout  Psychiatric:        Mood and Affect: Mood normal.      ED Treatments / Results  Labs (all labs ordered are listed, but only abnormal results are displayed) Labs Reviewed - No data to display  EKG None  Radiology Dg Chest 2 View  Result Date: 02/15/2018 CLINICAL DATA:  Unwitnessed fall EXAM: CHEST - 2 VIEW COMPARISON:  03/12/2017 FINDINGS: Chronic elevation of the right diaphragm. Normal hila size accounting for technique. Stable biapical pleural based scarring. There is no edema, consolidation, effusion, or pneumothorax. Remote L1 superior endplate fracture. No visible  acute fracture. Postoperative left breast and axilla IMPRESSION: Stable from prior.  No acute finding. Electronically Signed   By: Monte Fantasia M.D.   On: 02/15/2018 07:56   Dg Pelvis 1-2 Views  Result Date: 02/15/2018 CLINICAL DATA:  Unwitnessed fall.  No visible injuries. EXAM: PELVIS - 1-2 VIEW COMPARISON:  None. FINDINGS: There is no evidence of pelvic fracture or diastasis. No pelvic bone lesions are seen. Limited at the sacrum due to overlapping bowel gas. L4-5 disc narrowing. Osteopenia and atherosclerosis. IMPRESSION: No acute finding. Electronically Signed   By: Monte Fantasia M.D.   On: 02/15/2018 07:57   Ct Head Wo Contrast  Result Date: 02/15/2018 CLINICAL DATA:  Unwitnessed fall EXAM: CT HEAD WITHOUT CONTRAST CT CERVICAL SPINE WITHOUT CONTRAST TECHNIQUE: Multidetector CT imaging of the head and cervical spine was performed following the standard protocol without intravenous contrast. Multiplanar CT image reconstructions of the cervical spine were also generated. COMPARISON:  None. FINDINGS: CT HEAD FINDINGS Brain: There is atrophy and chronic small vessel disease changes. No acute intracranial abnormality. Specifically, no hemorrhage, hydrocephalus, mass lesion, acute infarction, or significant intracranial injury. Vascular: No hyperdense vessel or unexpected calcification. Skull: No acute calvarial abnormality. Sinuses/Orbits: Visualized paranasal sinuses and mastoids clear. Orbital soft tissues unremarkable. Other: None CT CERVICAL SPINE FINDINGS Alignment: Slight anterolisthesis of C3 on C4 and C4 on C5 felt to be related to facet disease. Skull base and vertebrae: No acute fracture. No primary bone lesion or focal pathologic process. Soft tissues and spinal canal: No prevertebral fluid or swelling. No visible canal hematoma. Disc levels: Diffuse degenerative disc disease, most pronounced at C5-6 and C6-7. Diffuse moderate to advanced degenerative facet disease bilaterally. Upper chest:  Biapical scarring. Other: None IMPRESSION: Atrophy, chronic microvascular disease. No acute intracranial abnormality. Degenerative disc and facet disease throughout the cervical spine. No acute bony abnormality. Electronically Signed   By: Rolm Baptise M.D.   On: 02/15/2018 08:25   Ct Cervical Spine Wo Contrast  Result Date: 02/15/2018 CLINICAL DATA:  Unwitnessed fall EXAM: CT HEAD WITHOUT CONTRAST CT CERVICAL SPINE WITHOUT CONTRAST TECHNIQUE: Multidetector CT imaging of the head and cervical spine was performed following the standard protocol without intravenous contrast. Multiplanar CT image reconstructions of the cervical spine were also generated. COMPARISON:  None. FINDINGS: CT HEAD FINDINGS Brain: There is atrophy and chronic small vessel disease changes. No acute intracranial abnormality. Specifically, no hemorrhage, hydrocephalus, mass lesion, acute infarction, or significant intracranial injury. Vascular: No hyperdense vessel or unexpected calcification. Skull: No acute calvarial abnormality. Sinuses/Orbits: Visualized paranasal sinuses and mastoids clear. Orbital soft tissues unremarkable. Other: None CT CERVICAL SPINE FINDINGS Alignment: Slight anterolisthesis of C3 on C4 and C4 on C5 felt to be related to facet disease. Skull base and vertebrae: No acute fracture. No primary bone lesion or focal pathologic process. Soft tissues and spinal canal: No prevertebral fluid or swelling. No visible canal hematoma. Disc levels: Diffuse degenerative disc disease, most pronounced at C5-6 and C6-7. Diffuse moderate to advanced degenerative facet disease bilaterally. Upper chest: Biapical scarring. Other: None IMPRESSION: Atrophy, chronic microvascular disease. No acute intracranial abnormality. Degenerative disc and facet disease throughout the cervical spine. No acute bony abnormality. Electronically Signed   By: Rolm Baptise M.D.   On: 02/15/2018 08:25    Procedures Procedures (including critical care  time)  Medications Ordered in ED Medications - No data to display   Initial Impression / Assessment and Plan / ED Course  I have reviewed the triage vital signs and the nursing notes.  Pertinent labs & imaging results that were available during my care of the patient were reviewed by me and considered in my medical decision making (see chart for details).     BRANTLEY NASER is an  85 year old female history of dementia, hypertension who presents to the ED after a fall.  Patient with normal vitals.  No fever.  Patient not on blood thinners.  Patient had fall that was unwitnessed.  She has complained of right-sided head pain.  Has mild hematoma to the right forehead.  Otherwise she appears neurologically intact.  No obvious bony tenderness.  Chest x-ray and pelvic x-ray were unremarkable.  CT of the head and neck were unremarkable.  Patient with no complaints upon my reevaluation and overall well appearing.  Discharged from ED in good condition given return precautions.  This chart was dictated using voice recognition software.  Despite best efforts to proofread,  errors can occur which can change the documentation meaning.   Final Clinical Impressions(s) / ED Diagnoses   Final diagnoses:  Fall, initial encounter    ED Discharge Orders    None       Lennice Sites, DO 02/15/18 2202

## 2018-02-15 NOTE — ED Notes (Signed)
Bed: WA21 Expected date:  Expected time:  Means of arrival:  Comments: fall

## 2018-02-24 ENCOUNTER — Emergency Department (HOSPITAL_COMMUNITY)
Admission: EM | Admit: 2018-02-24 | Discharge: 2018-02-25 | Disposition: A | Discharge status: Skilled Nursing Facility | Payer: Medicare Other | Attending: Emergency Medicine | Admitting: Emergency Medicine

## 2018-02-24 ENCOUNTER — Encounter (HOSPITAL_COMMUNITY): Payer: Self-pay

## 2018-02-24 DIAGNOSIS — S4992XA Unspecified injury of left shoulder and upper arm, initial encounter: Secondary | ICD-10-CM | POA: Diagnosis present

## 2018-02-24 DIAGNOSIS — Z7982 Long term (current) use of aspirin: Secondary | ICD-10-CM | POA: Diagnosis not present

## 2018-02-24 DIAGNOSIS — W19XXXA Unspecified fall, initial encounter: Secondary | ICD-10-CM | POA: Insufficient documentation

## 2018-02-24 DIAGNOSIS — F0390 Unspecified dementia without behavioral disturbance: Secondary | ICD-10-CM | POA: Insufficient documentation

## 2018-02-24 DIAGNOSIS — Z79899 Other long term (current) drug therapy: Secondary | ICD-10-CM | POA: Insufficient documentation

## 2018-02-24 DIAGNOSIS — Y929 Unspecified place or not applicable: Secondary | ICD-10-CM | POA: Diagnosis not present

## 2018-02-24 DIAGNOSIS — Y999 Unspecified external cause status: Secondary | ICD-10-CM | POA: Diagnosis not present

## 2018-02-24 DIAGNOSIS — I10 Essential (primary) hypertension: Secondary | ICD-10-CM | POA: Diagnosis not present

## 2018-02-24 DIAGNOSIS — Y939 Activity, unspecified: Secondary | ICD-10-CM | POA: Insufficient documentation

## 2018-02-24 DIAGNOSIS — S8011XA Contusion of right lower leg, initial encounter: Secondary | ICD-10-CM | POA: Insufficient documentation

## 2018-02-24 DIAGNOSIS — S42032A Displaced fracture of lateral end of left clavicle, initial encounter for closed fracture: Secondary | ICD-10-CM | POA: Diagnosis not present

## 2018-02-24 NOTE — ED Triage Notes (Signed)
Per EMS, patient coming from [facility] with complaints of unwitnessed fall about 8 hours ago. Day shift at facility found her on her bottom, but any further details are not available.   Patient is complaining of left shoulder pain and has bruising to the shoulder. Patient also has a skin tear on the right shin.   Patient has a history of dementia and is only oriented to person at baseline.

## 2018-02-25 ENCOUNTER — Emergency Department (HOSPITAL_COMMUNITY): Payer: Medicare Other

## 2018-02-25 ENCOUNTER — Other Ambulatory Visit (HOSPITAL_COMMUNITY): Payer: Medicare Other

## 2018-02-25 NOTE — ED Notes (Signed)
Bed: WHALB Expected date:  Expected time:  Means of arrival:  Comments: 

## 2018-02-25 NOTE — ED Notes (Signed)
Patient transported to X-ray & CT °

## 2018-02-25 NOTE — ED Notes (Addendum)
Patient unable to sign for discharge. RN signed for discharge. Patient transported to facility via [facility]. Attempted to call facility for report but the number was disconnected.

## 2018-02-25 NOTE — ED Notes (Signed)
Patient found ambulating the hall while I was in with another patient. Escorted patient back to room in wheelchair. Patient moved to hall bed so that she can be visualized at all times. Bed locked and in lowest position.

## 2018-02-25 NOTE — ED Notes (Signed)
[facility] called for transport back to [facility].  °

## 2018-02-25 NOTE — ED Provider Notes (Signed)
[facility] DEPT Provider Note   CSN: [id number] Arrival date & time: 02/24/18  2333     History   Chief Complaint Chief Complaint  Patient presents with  . Fall    HPI Amanda Randall is a 85 y.o. female.  Patient to the ED by EMS from [facility] where it was reported to EMS that the patient had an unwitnessed fall about 8 hours prior to contacting EMS. She was found sitting on the floor. Patient has a history of dementia and is reported to be at baseline per nursing home to EMS. No vomiting. She has a skin tear to right shin that was dressed but later continued to complain of left shoulder pain prompting transport to the emergency department for further evaluation. She is not anticoagulated.   The history is provided by the EMS personnel and the patient. No language interpreter was used.  Fall  Pertinent negatives include no chest pain, no abdominal pain, no headaches and no shortness of breath.    Past Medical History:  Diagnosis Date  . Dementia ([place])   . Hypertension     There are no active problems to display for this patient.   History reviewed. No pertinent surgical history.   OB History   No obstetric history on file.      Home Medications    Prior to Admission medications   Medication Sig Start Date End Date Taking? Authorizing Provider  aspirin 81 MG chewable tablet Chew 81 mg by mouth daily.    [provider]  chlorhexidine (PERIDEX) 0.12 % solution Use as directed 30 mLs in the mouth or throat 2 (two) times daily.    [provider]  citalopram (CELEXA) 20 MG tablet Take 20 mg by mouth daily.    [provider]  diazepam (VALIUM) 2 MG tablet Take 2 mg by mouth 2 (two) times daily.    [provider]  ENSURE (ENSURE) Take 1 Can by mouth 2 (two) times daily between meals.    [provider]  ferrous sulfate 325 (65 FE) MG tablet Take 325 mg by mouth daily with breakfast.     [provider]  lisinopril (PRINIVIL,ZESTRIL) 20 MG tablet Take 20 mg by mouth daily.    [provider]  memantine (NAMENDA) 10 MG tablet Take 10 mg by mouth 2 (two) times daily.    [provider]  metoprolol tartrate (LOPRESSOR) 25 MG tablet Take 12.5 mg by mouth 2 (two) times daily.     [provider]  mirtazapine (REMERON) 7.5 MG tablet Take 7.5 mg by mouth at bedtime.    [provider]  polyethylene glycol (MIRALAX / GLYCOLAX) packet Take 17 g by mouth daily.    [provider]  pregabalin (LYRICA) 50 MG capsule Take 50 mg by mouth daily.    [provider]  rivastigmine (EXELON) 3 MG capsule Take 3 mg by mouth 2 (two) times daily.    [provider]  vitamin B-12 (CYANOCOBALAMIN) 1000 MCG tablet Take 1,000 mcg by mouth daily.    [provider]    Family History History reviewed. No pertinent family history.  Social History Social History   Tobacco Use  . Smoking status: Never Smoker  . Smokeless tobacco: Never Used  Substance Use Topics  . Alcohol use: No  . Drug use: No     Allergies   Patient has no known allergies.   Review of Systems Review of Systems  Constitutional: Negative for chills and fever.  HENT: Negative.  Negative for nosebleeds.   Respiratory: Negative.  Negative for shortness of breath.   Cardiovascular: Negative.  Negative for chest pain.  Gastrointestinal: Negative.  Negative for abdominal pain and vomiting.  Musculoskeletal: Negative for neck pain.       See HPI.  Skin: Positive for wound.  Neurological: Negative.  Negative for headaches.  Psychiatric/Behavioral: Positive for confusion (At baseline).     Physical Exam Updated Vital Signs BP 110/71 (BP Location: Right Arm)   Pulse 98   Temp 97.6 F (36.4 C) (Oral)   Resp 16   SpO2 96%   Physical Exam Vitals signs and nursing note reviewed.  Constitutional:      Comments: Somnolent, easy to waken    HENT:     Head: Normocephalic and atraumatic.     Nose: Nose normal.  Eyes:     Pupils: Pupils are equal, round, and reactive to light.  Neck:     Musculoskeletal: Normal range of motion. No muscular tenderness.  Cardiovascular:     Rate and Rhythm: Normal rate and regular rhythm.     Heart sounds: No murmur.  Pulmonary:     Effort: Pulmonary effort is normal.     Breath sounds: No wheezing, rhonchi or rales.  Chest:     Chest wall: No tenderness.  Abdominal:     Tenderness: There is no abdominal tenderness.  Skin:    General: Skin is warm and dry.     Comments: Left shoulder bruised over anterior surface. Tender. No skin tenting or visualized deformity. Tenderness extends to proximal humerus. No other left UE tenderness or bruising. No midline or paracervical tenderness. Right lower leg bandaged and tender. No pelvic tenderness. She moves bilateral LE's without limitation.  Neurological:     Mental Status: She is oriented to person, place, and time.      ED Treatments / Results  Labs (all labs ordered are listed, but only abnormal results are displayed) Labs Reviewed - No data to display  EKG None  Radiology No results found.  Procedures Procedures (including critical care time)  Medications Ordered in ED Medications - No data to display   Initial Impression / Assessment and Plan / ED Course  I have reviewed the triage vital signs and the nursing notes.  Pertinent labs & imaging results that were available during my care of the patient were reviewed by me and considered in my medical decision making (see chart for details).     Patient to ED several hours after being found on the floor at nursing facility with skin tear to right lower leg. Through the last hours the patient continued to complain of left shoulder pain and nursing noted bruising, prompting transfer to ED for evaluation.   The patient is in NAD, c/o left shoulder and right lower leg pain. Moves  all extremities limiting the left UE secondary to pain in the shoulder.   She is found to have a clavicle fracture. Imaging of the right lower leg is negative. No neck injury. Mentation is stable and is unchanged through the duration of ED encounter. VSS. Do not suspect other injury.   She is stable for transportation back to her living facility.   Final Clinical Impressions(s) / ED Diagnoses   Final diagnoses:  None   1. Unwitnessed fall 2. Left clavicle fracture.  3. Right lower leg contusion  ED Discharge Orders    None  Charlann Lange, PA-C 02/26/18 [id number]    Merrily Pew, MD 02/26/18 [phone]

## 2018-02-25 NOTE — Discharge Instructions (Addendum)
You are found to have a clavicle fracture tonight. No splinting is necessary but the area will be sore for 2-3 weeks. Follow up with your doctor for recheck next week to insure no other symptoms develop.   Dg Chest 2 View  Result Date: 02/15/2018 CLINICAL DATA:  Unwitnessed fall EXAM: CHEST - 2 VIEW COMPARISON:  03/12/2017 FINDINGS: Chronic elevation of the right diaphragm. Normal hila size accounting for technique. Stable biapical pleural based scarring. There is no edema, consolidation, effusion, or pneumothorax. Remote L1 superior endplate fracture. No visible acute fracture. Postoperative left breast and axilla IMPRESSION: Stable from prior.  No acute finding. Electronically Signed   By: Monte Fantasia M.D.   On: 02/15/2018 07:56   Dg Pelvis 1-2 Views  Result Date: 02/15/2018 CLINICAL DATA:  Unwitnessed fall.  No visible injuries. EXAM: PELVIS - 1-2 VIEW COMPARISON:  None. FINDINGS: There is no evidence of pelvic fracture or diastasis. No pelvic bone lesions are seen. Limited at the sacrum due to overlapping bowel gas. L4-5 disc narrowing. Osteopenia and atherosclerosis. IMPRESSION: No acute finding. Electronically Signed   By: Monte Fantasia M.D.   On: 02/15/2018 07:57   Dg Tibia/fibula Right  Result Date: 02/25/2018 CLINICAL DATA:  Fall EXAM: RIGHT TIBIA AND FIBULA - 2 VIEW COMPARISON:  Right knee radiograph 03/26/2017 FINDINGS: There is no evidence of fracture or other focal bone lesion. There is femorotibial chondrocalcinosis, unchanged. IMPRESSION: No acute osseous injury of the right tibia and fibula. Electronically Signed   By: Ulyses Jarred M.D.   On: 02/25/2018 00:47   Ct Head Wo Contrast  Result Date: 02/15/2018 CLINICAL DATA:  Unwitnessed fall EXAM: CT HEAD WITHOUT CONTRAST CT CERVICAL SPINE WITHOUT CONTRAST TECHNIQUE: Multidetector CT imaging of the head and cervical spine was performed following the standard protocol without intravenous contrast. Multiplanar CT image reconstructions  of the cervical spine were also generated. COMPARISON:  None. FINDINGS: CT HEAD FINDINGS Brain: There is atrophy and chronic small vessel disease changes. No acute intracranial abnormality. Specifically, no hemorrhage, hydrocephalus, mass lesion, acute infarction, or significant intracranial injury. Vascular: No hyperdense vessel or unexpected calcification. Skull: No acute calvarial abnormality. Sinuses/Orbits: Visualized paranasal sinuses and mastoids clear. Orbital soft tissues unremarkable. Other: None CT CERVICAL SPINE FINDINGS Alignment: Slight anterolisthesis of C3 on C4 and C4 on C5 felt to be related to facet disease. Skull base and vertebrae: No acute fracture. No primary bone lesion or focal pathologic process. Soft tissues and spinal canal: No prevertebral fluid or swelling. No visible canal hematoma. Disc levels: Diffuse degenerative disc disease, most pronounced at C5-6 and C6-7. Diffuse moderate to advanced degenerative facet disease bilaterally. Upper chest: Biapical scarring. Other: None IMPRESSION: Atrophy, chronic microvascular disease. No acute intracranial abnormality. Degenerative disc and facet disease throughout the cervical spine. No acute bony abnormality. Electronically Signed   By: Rolm Baptise M.D.   On: 02/15/2018 08:25   Ct Cervical Spine Wo Contrast  Result Date: 02/25/2018 CLINICAL DATA:  Fall EXAM: CT CERVICAL SPINE WITHOUT CONTRAST TECHNIQUE: Multidetector CT imaging of the cervical spine was performed without intravenous contrast. Multiplanar CT image reconstructions were also generated. COMPARISON:  None. FINDINGS: Alignment: Grade 1 anterolisthesis at C3-4 and C4-5. Reversal of normal lordotic curvature. Skull base and vertebrae: No acute fracture. Soft tissues and spinal canal: No prevertebral fluid or swelling. No visible canal hematoma. Disc levels: Left C4-5 facets are fused. Disc space narrowing is greatest at C5-6 and C6-7. No spinal canal stenosis. Upper chest:  Biapical scarring. Other: Normal  visualized paraspinal cervical soft tissues. IMPRESSION: No acute fracture of the cervical spine. Electronically Signed   By: Ulyses Jarred M.D.   On: 02/25/2018 00:50   Ct Cervical Spine Wo Contrast  Result Date: 02/15/2018 CLINICAL DATA:  Unwitnessed fall EXAM: CT HEAD WITHOUT CONTRAST CT CERVICAL SPINE WITHOUT CONTRAST TECHNIQUE: Multidetector CT imaging of the head and cervical spine was performed following the standard protocol without intravenous contrast. Multiplanar CT image reconstructions of the cervical spine were also generated. COMPARISON:  None. FINDINGS: CT HEAD FINDINGS Brain: There is atrophy and chronic small vessel disease changes. No acute intracranial abnormality. Specifically, no hemorrhage, hydrocephalus, mass lesion, acute infarction, or significant intracranial injury. Vascular: No hyperdense vessel or unexpected calcification. Skull: No acute calvarial abnormality. Sinuses/Orbits: Visualized paranasal sinuses and mastoids clear. Orbital soft tissues unremarkable. Other: None CT CERVICAL SPINE FINDINGS Alignment: Slight anterolisthesis of C3 on C4 and C4 on C5 felt to be related to facet disease. Skull base and vertebrae: No acute fracture. No primary bone lesion or focal pathologic process. Soft tissues and spinal canal: No prevertebral fluid or swelling. No visible canal hematoma. Disc levels: Diffuse degenerative disc disease, most pronounced at C5-6 and C6-7. Diffuse moderate to advanced degenerative facet disease bilaterally. Upper chest: Biapical scarring. Other: None IMPRESSION: Atrophy, chronic microvascular disease. No acute intracranial abnormality. Degenerative disc and facet disease throughout the cervical spine. No acute bony abnormality. Electronically Signed   By: Rolm Baptise M.D.   On: 02/15/2018 08:25   Dg Shoulder Left  Result Date: 02/25/2018 CLINICAL DATA:  Fall. Pain. EXAM: LEFT SHOULDER - 2+ VIEW COMPARISON:  Chest radiograph  02/15/2018. FINDINGS: Surgical clips in the left axilla. Superiorly displaced distal clavicle fracture. No dislocation of the glenohumeral joint. Grossly normal left hemithorax. IMPRESSION: Displaced distal clavicle fracture. Electronically Signed   By: Abigail Miyamoto M.D.   On: 02/25/2018 00:55

## 2018-07-30 ENCOUNTER — Emergency Department (HOSPITAL_COMMUNITY): Payer: Medicare Other

## 2018-07-30 ENCOUNTER — Other Ambulatory Visit: Payer: Self-pay

## 2018-07-30 ENCOUNTER — Emergency Department (HOSPITAL_COMMUNITY)
Admission: EM | Admit: 2018-07-30 | Discharge: 2018-07-30 | Disposition: A | Discharge status: Home / Self Care | Payer: Medicare Other | Attending: Emergency Medicine | Admitting: Emergency Medicine

## 2018-07-30 DIAGNOSIS — Y92121 Bathroom in nursing home as the place of occurrence of the external cause: Secondary | ICD-10-CM | POA: Insufficient documentation

## 2018-07-30 DIAGNOSIS — Z7982 Long term (current) use of aspirin: Secondary | ICD-10-CM | POA: Insufficient documentation

## 2018-07-30 DIAGNOSIS — W19XXXA Unspecified fall, initial encounter: Secondary | ICD-10-CM | POA: Diagnosis not present

## 2018-07-30 DIAGNOSIS — Y9389 Activity, other specified: Secondary | ICD-10-CM | POA: Diagnosis not present

## 2018-07-30 DIAGNOSIS — F0390 Unspecified dementia without behavioral disturbance: Secondary | ICD-10-CM | POA: Diagnosis not present

## 2018-07-30 DIAGNOSIS — S51011A Laceration without foreign body of right elbow, initial encounter: Secondary | ICD-10-CM | POA: Insufficient documentation

## 2018-07-30 DIAGNOSIS — I10 Essential (primary) hypertension: Secondary | ICD-10-CM | POA: Diagnosis not present

## 2018-07-30 DIAGNOSIS — Y999 Unspecified external cause status: Secondary | ICD-10-CM | POA: Diagnosis not present

## 2018-07-30 DIAGNOSIS — S51012A Laceration without foreign body of left elbow, initial encounter: Secondary | ICD-10-CM

## 2018-07-30 DIAGNOSIS — S0990XA Unspecified injury of head, initial encounter: Secondary | ICD-10-CM | POA: Diagnosis present

## 2018-07-30 NOTE — ED Notes (Signed)
Pt ambulated to the restroom with 1 staff assist.

## 2018-07-30 NOTE — ED Triage Notes (Signed)
Per EMS - Pt had unwitnessed fall in bathroom at SNF, unknown amount of time on floor. Pt was not using needed walker. Only observed injuries is rt sided skin tear to elbow/arm. Pt only com-plains of pain to rt arm at laceration/tear. Pt only alert to self @ baseline.    146 60 72 hR 19r 95%

## 2018-07-30 NOTE — ED Notes (Signed)
Bed: PH43 Expected date:  Expected time:  Means of arrival:  Comments: EMS 85 yo female from SNF-fall-skin tear right arm/elbow/dementia

## 2018-07-30 NOTE — ED Provider Notes (Signed)
[facility] DEPT Provider Note   CSN: [id number] Arrival date & time: 07/30/18  1934     History   Chief Complaint Chief Complaint  Patient presents with  . Fall  . Skin tear right elbow    HPI Amanda Randall is a 85 y.o. female.  Level 5 caveat secondary to dementia.  Patient had an unwitnessed fall in the bathroom at her skilled nursing facility.  She was found by staff with some bleeding around her right elbow.  Patient herself does not recall the fall but does complain of elbow pain.  She denies any other injuries or pain.     The history is provided by the patient and the EMS personnel.  Fall This is a new problem. The current episode started less than 1 hour ago. The problem has not changed since onset.Pertinent negatives include no chest pain, no abdominal pain, no headaches and no shortness of breath. Nothing aggravates the symptoms. Nothing relieves the symptoms. She has tried nothing for the symptoms. The treatment provided no relief.    Past Medical History:  Diagnosis Date  . Dementia ([place])   . Hypertension     There are no active problems to display for this patient.   No past surgical history on file.   OB History   No obstetric history on file.      Home Medications    Prior to Admission medications   Medication Sig Start Date End Date Taking? Authorizing Provider  citalopram (CELEXA) 20 MG tablet Take 20 mg by mouth daily.   Yes [provider]  aspirin 81 MG chewable tablet Chew 81 mg by mouth daily.    [provider]  chlorhexidine (PERIDEX) 0.12 % solution Use as directed 30 mLs in the mouth or throat 2 (two) times daily.    [provider]  diazepam (VALIUM) 2 MG tablet Take 2 mg by mouth 2 (two) times daily.    [provider]  ENSURE (ENSURE) Take 1 Can by mouth 2 (two) times daily between meals.    [provider]  ferrous sulfate 325 (65 FE) MG tablet Take 325 mg by  mouth daily with breakfast.    [provider]  lisinopril (PRINIVIL,ZESTRIL) 20 MG tablet Take 20 mg by mouth daily.    [provider]  memantine (NAMENDA) 10 MG tablet Take 10 mg by mouth 2 (two) times daily.    [provider]  metoprolol tartrate (LOPRESSOR) 25 MG tablet Take 12.5 mg by mouth 2 (two) times daily.     [provider]  mirtazapine (REMERON) 7.5 MG tablet Take 7.5 mg by mouth at bedtime.    [provider]  polyethylene glycol (MIRALAX / GLYCOLAX) packet Take 17 g by mouth daily.    [provider]  pregabalin (LYRICA) 50 MG capsule Take 50 mg by mouth daily.    [provider]  rivastigmine (EXELON) 3 MG capsule Take 3 mg by mouth 2 (two) times daily.    [provider]  vitamin B-12 (CYANOCOBALAMIN) 1000 MCG tablet Take 1,000 mcg by mouth daily.    [provider]    Family History No family history on file.  Social History Social History   Tobacco Use  . Smoking status: Never Smoker  . Smokeless tobacco: Never Used  Substance Use Topics  . Alcohol use: No  . Drug use: No     Allergies   Patient has no known allergies.  Review of Systems Review of Systems  Unable to perform ROS: Dementia  Respiratory: Negative for shortness of breath.   Cardiovascular: Negative for chest pain.  Gastrointestinal: Negative for abdominal pain.  Neurological: Negative for headaches.     Physical Exam Updated Vital Signs BP (!) 150/74 (BP Location: Left Arm)   Pulse 78   Temp 98.4 F (36.9 C) (Oral)   Resp 16   SpO2 95%   Physical Exam Vitals signs and nursing note reviewed.  Constitutional:      General: She is not in acute distress.    Appearance: She is well-developed.  HENT:     Head: Normocephalic and atraumatic.  Eyes:     Conjunctiva/sclera: Conjunctivae normal.  Neck:     Musculoskeletal: Neck supple.  Cardiovascular:     Rate and Rhythm: Normal rate and regular  rhythm.     Heart sounds: No murmur.  Pulmonary:     Effort: Pulmonary effort is normal. No respiratory distress.     Breath sounds: Normal breath sounds.  Abdominal:     Palpations: Abdomen is soft.     Tenderness: There is no abdominal tenderness.  Musculoskeletal: Normal range of motion.     Comments: She is a large stellate skin tear to the left elbow.  Skin:    General: Skin is warm and dry.     Capillary Refill: Capillary refill takes less than 2 seconds.  Neurological:     General: No focal deficit present.     Mental Status: She is alert. Mental status is at baseline. She is disoriented.      ED Treatments / Results  Labs (all labs ordered are listed, but only abnormal results are displayed) Labs Reviewed - No data to display  EKG None  Radiology Dg Elbow Complete Right  Result Date: 07/30/2018 CLINICAL DATA:  Fall, elbow pain EXAM: RIGHT ELBOW - COMPLETE 3+ VIEW COMPARISON:  12/13/2017 FINDINGS: No fracture or malalignment. No significant elbow effusion. Radial head alignment is normal IMPRESSION: Negative. Electronically Signed   By: Donavan Foil M.D.   On: 07/30/2018 20:36   Ct Head Wo Contrast  Result Date: 07/30/2018 CLINICAL DATA:  Fall EXAM: CT HEAD WITHOUT CONTRAST CT CERVICAL SPINE WITHOUT CONTRAST TECHNIQUE: Multidetector CT imaging of the head and cervical spine was performed following the standard protocol without intravenous contrast. Multiplanar CT image reconstructions of the cervical spine were also generated. COMPARISON:  CT cervical spine 02/25/2018 Head CT 02/15/2018 FINDINGS: CT HEAD FINDINGS Brain: There is no mass, hemorrhage or extra-axial collection. The size and configuration of the ventricles and extra-axial CSF spaces are normal. There is hypoattenuation of the periventricular white matter, most commonly indicating chronic ischemic microangiopathy. Old left basal ganglia lacunar infarct. Vascular: Atherosclerotic calcification of the internal  carotid arteries at the skull base. No abnormal hyperdensity of the major intracranial arteries or dural venous sinuses. Skull: The visualized skull base, calvarium and extracranial soft tissues are normal. Sinuses/Orbits: Opacification of right mastoid air cells. No middle ear effusion. Visualized paranasal sinuses are clear. The orbits are normal. CT CERVICAL SPINE FINDINGS Alignment: Grade 1 anterolisthesis at C3-4 and C4-5. Skull base and vertebrae: No acute fracture. Soft tissues and spinal canal: No prevertebral fluid or swelling. No visible canal hematoma. Disc levels: Facet hypertrophy is worst at C4-5. There is endplate sclerosis at V8-9. No bony spinal canal stenosis. No large disc herniation. Upper chest: No pneumothorax, pulmonary nodule or pleural effusion. Other: Normal visualized paraspinal cervical soft tissues. IMPRESSION: 1.  No acute abnormality of the head or cervical spine. 2. Chronic ischemic microangiopathy. 3. Multilevel cervical facet arthrosis with degenerative grade 1 anterolisthesis at C3-4 and C4-5. Electronically Signed   By: Ulyses Jarred M.D.   On: 07/30/2018 21:06   Ct Cervical Spine Wo Contrast  Result Date: 07/30/2018 CLINICAL DATA:  Fall EXAM: CT HEAD WITHOUT CONTRAST CT CERVICAL SPINE WITHOUT CONTRAST TECHNIQUE: Multidetector CT imaging of the head and cervical spine was performed following the standard protocol without intravenous contrast. Multiplanar CT image reconstructions of the cervical spine were also generated. COMPARISON:  CT cervical spine 02/25/2018 Head CT 02/15/2018 FINDINGS: CT HEAD FINDINGS Brain: There is no mass, hemorrhage or extra-axial collection. The size and configuration of the ventricles and extra-axial CSF spaces are normal. There is hypoattenuation of the periventricular white matter, most commonly indicating chronic ischemic microangiopathy. Old left basal ganglia lacunar infarct. Vascular: Atherosclerotic calcification of the internal carotid  arteries at the skull base. No abnormal hyperdensity of the major intracranial arteries or dural venous sinuses. Skull: The visualized skull base, calvarium and extracranial soft tissues are normal. Sinuses/Orbits: Opacification of right mastoid air cells. No middle ear effusion. Visualized paranasal sinuses are clear. The orbits are normal. CT CERVICAL SPINE FINDINGS Alignment: Grade 1 anterolisthesis at C3-4 and C4-5. Skull base and vertebrae: No acute fracture. Soft tissues and spinal canal: No prevertebral fluid or swelling. No visible canal hematoma. Disc levels: Facet hypertrophy is worst at C4-5. There is endplate sclerosis at W3-8. No bony spinal canal stenosis. No large disc herniation. Upper chest: No pneumothorax, pulmonary nodule or pleural effusion. Other: Normal visualized paraspinal cervical soft tissues. IMPRESSION: 1. No acute abnormality of the head or cervical spine. 2. Chronic ischemic microangiopathy. 3. Multilevel cervical facet arthrosis with degenerative grade 1 anterolisthesis at C3-4 and C4-5. Electronically Signed   By: Ulyses Jarred M.D.   On: 07/30/2018 21:06    Procedures .Marland KitchenLaceration Repair  Date/Time: 07/31/2018 8:41 AM Performed by: Hayden Rasmussen, MD Authorized by: Hayden Rasmussen, MD   Consent:    Consent obtained:  Emergent situation and verbal   Consent given by:  Patient Anesthesia (see MAR for exact dosages):    Anesthesia method:  None Laceration details:    Location:  Shoulder/arm   Shoulder/arm location:  R elbow   Length (cm):  10 Repair type:    Repair type:  Simple Treatment:    Area cleansed with:  Saline   Amount of cleaning:  Standard Skin repair:    Repair method:  Steri-Strips   Number of Steri-Strips:  4 Approximation:    Approximation:  Loose Post-procedure details:    Dressing:  Non-adherent dressing   Patient tolerance of procedure:  Tolerated well, no immediate complications   (including critical care time)  Medications  Ordered in ED Medications - No data to display   Initial Impression / Assessment and Plan / ED Course  I have reviewed the triage vital signs and the nursing notes.  Pertinent labs & imaging results that were available during my care of the patient were reviewed by me and considered in my medical decision making (see chart for details).  Clinical Course as of Jul 31 839  Sun Jul 30, 7963  2060 85 year old female with dementia here with right elbow skin tear in the setting of an unwitnessed fall at her facility.  Patient denies complaints.  I was able to Steri-Strip the area and reapproximate it somewhat although I think there is some tissue missing.  X-ray of the elbow was negative.  She is in for CT head and CT C-spine as her dementia makes her less reliable as a historian for not having any complaints.  Anticipate discharge back to facility if scans are negative.   [MB]  2110 Patient CTs and readings reviewed by me.  No acute findings.   [MB]    Clinical Course User Index [MB] Hayden Rasmussen, MD        Final Clinical Impressions(s) / ED Diagnoses   Final diagnoses:  Skin tear of left elbow without complication, initial encounter  Fall, initial encounter    ED Discharge Orders    None       Hayden Rasmussen, MD 07/31/18 [phone]

## 2018-07-30 NOTE — ED Notes (Signed)
[facility] contacted and paperwork printed  

## 2018-07-30 NOTE — Discharge Instructions (Addendum)
You were seen in the emergency department for injuries from a fall.  You had a large skin tear on your right elbow that was Steri-Stripped to try to cover the area as best as possible.  This wound should be cleaned with soap and water and dressed daily.  Please watch for any signs of infection.  You had an x-ray of the elbow along with a CAT scan of your head and neck that did not show any obvious fractures.  Please follow-up with your doctor and return if any worsening symptoms.

## 2018-07-30 NOTE — ED Notes (Signed)
Wound cleaned with dermal cleanser and gauze lightly placed on top.

## 2018-10-14 ENCOUNTER — Emergency Department (HOSPITAL_COMMUNITY): Payer: Medicare Other

## 2018-10-14 ENCOUNTER — Other Ambulatory Visit: Payer: Self-pay

## 2018-10-14 ENCOUNTER — Emergency Department (HOSPITAL_COMMUNITY)
Admission: EM | Admit: 2018-10-14 | Discharge: 2018-10-14 | Disposition: A | Discharge status: Home / Self Care | Payer: Medicare Other | Attending: Emergency Medicine | Admitting: Emergency Medicine

## 2018-10-14 ENCOUNTER — Encounter (HOSPITAL_COMMUNITY): Payer: Self-pay

## 2018-10-14 DIAGNOSIS — S41111A Laceration without foreign body of right upper arm, initial encounter: Secondary | ICD-10-CM | POA: Diagnosis not present

## 2018-10-14 DIAGNOSIS — Y92129 Unspecified place in nursing home as the place of occurrence of the external cause: Secondary | ICD-10-CM | POA: Diagnosis not present

## 2018-10-14 DIAGNOSIS — Y998 Other external cause status: Secondary | ICD-10-CM | POA: Insufficient documentation

## 2018-10-14 DIAGNOSIS — Z79899 Other long term (current) drug therapy: Secondary | ICD-10-CM | POA: Diagnosis not present

## 2018-10-14 DIAGNOSIS — I10 Essential (primary) hypertension: Secondary | ICD-10-CM | POA: Insufficient documentation

## 2018-10-14 DIAGNOSIS — F0390 Unspecified dementia without behavioral disturbance: Secondary | ICD-10-CM | POA: Insufficient documentation

## 2018-10-14 DIAGNOSIS — S51012A Laceration without foreign body of left elbow, initial encounter: Secondary | ICD-10-CM | POA: Diagnosis not present

## 2018-10-14 DIAGNOSIS — Y939 Activity, unspecified: Secondary | ICD-10-CM | POA: Insufficient documentation

## 2018-10-14 DIAGNOSIS — W19XXXA Unspecified fall, initial encounter: Secondary | ICD-10-CM | POA: Insufficient documentation

## 2018-10-14 DIAGNOSIS — S4991XA Unspecified injury of right shoulder and upper arm, initial encounter: Secondary | ICD-10-CM | POA: Diagnosis present

## 2018-10-14 LAB — BASIC METABOLIC PANEL
Anion gap: 9 (ref 5–15)
BUN: 24 mg/dL — ABNORMAL HIGH (ref 8–23)
CO2: 27 mmol/L (ref 22–32)
Calcium: 9 mg/dL (ref 8.9–10.3)
Chloride: 104 mmol/L (ref 98–111)
Creatinine, Ser: 0.66 mg/dL (ref 0.44–1.00)
GFR calc Af Amer: >60 mL/min (ref >60)
GFR calc non Af Amer: >60 mL/min (ref >60)
Glucose, Bld: 93 mg/dL (ref 70–99)
Potassium: 4.3 mmol/L (ref 3.5–5.1)
Sodium: 140 mmol/L (ref 135–145)

## 2018-10-14 LAB — CBC
HCT: 44.5 % (ref 36.0–46.0)
Hemoglobin: 14 g/dL (ref 12.0–15.0)
MCH: 28.7 pg (ref 26.0–34.0)
MCHC: 31.5 g/dL (ref 30.0–36.0)
MCV: 91.2 fL (ref 80.0–100.0)
Platelets: 164 10*3/uL (ref 150–400)
RBC: 4.88 MIL/uL (ref 3.87–5.11)
RDW: 13.2 % (ref 11.5–15.5)
WBC: 5 10*3/uL (ref 4.0–10.5)
nRBC: 0 % (ref 0.0–0.2)

## 2018-10-14 MED ORDER — DIPHENHYDRAMINE HCL 50 MG/ML IJ SOLN: 25.0000 mg | Freq: Once | INTRAMUSCULAR | Status: DC

## 2018-10-14 MED ORDER — LORAZEPAM 2 MG/ML IJ SOLN
1.0000 mg | Freq: Once | INTRAMUSCULAR | Status: AC
  Administered 2018-10-14: 1 mg via INTRAVENOUS
  Filled 2018-10-14: qty 1

## 2018-10-14 MED ORDER — LIDOCAINE-EPINEPHRINE (PF) 2 %-1:200000 IJ SOLN
10.0000 mL | Freq: Once | INTRAMUSCULAR | Status: AC
  Administered 2018-10-14: 10 mL
  Filled 2018-10-14: qty 10

## 2018-10-14 NOTE — ED Provider Notes (Signed)
..  Laceration Repair  Date/Time: 10/14/2018 4:10 PM Performed by: Eustaquio Maize, PA-C Authorized by: Eustaquio Maize, PA-C   Consent:    Consent obtained:  Verbal   Consent given by:  Patient   Risks discussed:  Infection, pain and poor cosmetic result Anesthesia (see MAR for exact dosages):    Anesthesia method:  Local infiltration   Local anesthetic:  Lidocaine 2% WITH epi Laceration details:    Location:  Shoulder/arm   Shoulder/arm location:  R upper arm   Length (cm):  5   Depth (mm):  5 Repair type:    Repair type:  Complex Pre-procedure details:    Preparation:  Patient was prepped and draped in usual sterile fashion Exploration:    Hemostasis achieved with:  Epinephrine Treatment:    Area cleansed with:  Betadine   Irrigation solution:  Sterile saline Skin repair:    Repair method:  Sutures   Suture size:  5-0   Suture material:  Prolene   Suture technique:  Simple interrupted   Number of sutures:  12 Approximation:    Approximation:  Loose Post-procedure details:    Dressing:  Non-adherent dressing   Patient tolerance of procedure:  Tolerated well, no immediate complications      Eustaquio Maize, PA-C 10/14/18 1611    Dorie Rank, MD 10/15/18 [phone]

## 2018-10-14 NOTE — ED Triage Notes (Signed)
Pt BIB EMS from memory care unit at [facility]. Pt c/o falling, unwitnessed fall. Pt has laceration to R arm, L elbow. Pt not on blood thinners. Pt has dementia.   140/86 100% RA

## 2018-10-14 NOTE — ED Notes (Signed)
Attempted to perform and obtain EKG- cables not functioning correctly . Will perform portable EKG upon patient's return to room from radiology

## 2018-10-14 NOTE — Discharge Instructions (Signed)
Suture removal in 10 days.  Apply abx ointment to the wound daily.

## 2018-10-14 NOTE — ED Provider Notes (Signed)
[facility] DEPT Provider Note   CSN: [id number] Arrival date & time: 10/14/18  1149     History   Chief Complaint Chief Complaint  Patient presents with  . Fall    HPI Amanda Randall is a 85 y.o. female.     HPI Patient presents to the emergency room for evaluation of an unwitnessed fall at her nursing facility.  Per EMS report, the patient is a resident at [facility].  She had an unwitnessed fall today.  Patient is unable to tell me what happened.  She was noted to have a laceration of her right arm and left elbow.  Patient is complaining of pain of her right arm and also gesturing towards the C-spine collar.  Is unclear if her neck is actually hurting versus the collar bothering her chin.  She does point more towards her chin.  She denies any complaints of headache.  No complaints of chest pain or shortness of breath.  No complaints of leg pain or back pain. Past Medical History:  Diagnosis Date  . Dementia ([place])   . Hypertension     There are no active problems to display for this patient.   History reviewed. No pertinent surgical history.   OB History   No obstetric history on file.      Home Medications    Prior to Admission medications   Medication Sig Start Date End Date Taking? Authorizing Provider  citalopram (CELEXA) 20 MG tablet Take 20 mg by mouth daily.   Yes [provider]  diazepam (VALIUM) 2 MG tablet Take 2 mg by mouth 2 (two) times daily.   Yes [provider]  ENSURE (ENSURE) Take 1 Can by mouth 2 (two) times daily between meals.   Yes [provider]  memantine (NAMENDA) 10 MG tablet Take 10 mg by mouth 2 (two) times daily.   Yes [provider]  metoprolol tartrate (LOPRESSOR) 25 MG tablet Take 12.5 mg by mouth 2 (two) times daily.    Yes [provider]  mirtazapine (REMERON) 7.5 MG tablet Take 7.5 mg by mouth at bedtime.   Yes [provider]  polyethylene  glycol (MIRALAX / GLYCOLAX) packet Take 17 g by mouth daily.   Yes [provider]  rivastigmine (EXELON) 3 MG capsule Take 3 mg by mouth 2 (two) times daily.   Yes [provider]  vitamin B-12 (CYANOCOBALAMIN) 1000 MCG tablet Take 1,000 mcg by mouth daily.   Yes [provider]    Family History History reviewed. No pertinent family history.  Social History Social History   Tobacco Use  . Smoking status: Never Smoker  . Smokeless tobacco: Never Used  Substance Use Topics  . Alcohol use: No  . Drug use: No     Allergies   Patient has no known allergies.   Review of Systems Review of Systems  All other systems reviewed and are negative.    Physical Exam Updated Vital Signs BP (!) 148/96   Pulse 87   Temp 98.7 F (37.1 C) (Oral)   Resp 18   SpO2 97%   Physical Exam Vitals signs and nursing note reviewed.  Constitutional:      General: She is not in acute distress.    Appearance: She is well-developed.     Comments: Elderly, frail  HENT:     Head: Normocephalic and atraumatic.     Right Ear: External ear normal.     Left Ear:  External ear normal.  Eyes:     General: No scleral icterus.       Right eye: No discharge.        Left eye: No discharge.     Conjunctiva/sclera: Conjunctivae normal.  Neck:     Musculoskeletal: Neck supple.     Trachea: No tracheal deviation.     Comments: C-collar in place Cardiovascular:     Rate and Rhythm: Normal rate and regular rhythm.  Pulmonary:     Effort: Pulmonary effort is normal. No respiratory distress.     Breath sounds: Normal breath sounds. No stridor. No wheezing or rales.  Abdominal:     General: Bowel sounds are normal. There is no distension.     Palpations: Abdomen is soft.     Tenderness: There is no abdominal tenderness. There is no guarding or rebound.  Musculoskeletal:     Right shoulder: She exhibits no tenderness, no bony tenderness and no swelling.     Left shoulder: She  exhibits no tenderness, no bony tenderness and no swelling.     Right elbow: She exhibits laceration. Tenderness found.     Left elbow: She exhibits laceration.     Right wrist: She exhibits no tenderness, no bony tenderness and no swelling.     Left wrist: She exhibits no tenderness, no bony tenderness and no swelling.     Right hip: She exhibits normal range of motion, no tenderness, no bony tenderness and no swelling.     Left hip: She exhibits normal range of motion, no tenderness and no bony tenderness.     Right ankle: She exhibits no swelling. No tenderness.     Left ankle: She exhibits no swelling. No tenderness.     Cervical back: She exhibits no tenderness, no bony tenderness and no swelling.     Thoracic back: She exhibits no tenderness, no bony tenderness and no swelling.     Lumbar back: She exhibits no tenderness, no bony tenderness and no swelling.  Skin:    General: Skin is warm and dry.     Findings: No rash.  Neurological:     Mental Status: She is alert.     GCS: GCS eye subscore is 4. GCS verbal subscore is 4. GCS motor subscore is 6.     Cranial Nerves: No cranial nerve deficit (no facial droop, extraocular movements intact, no slurred speech).     Sensory: No sensory deficit.     Motor: No abnormal muscle tone or seizure activity.     Coordination: Coordination normal.     Comments: Patient is able to move all extremities, she is alert and follows commands, she does have poor memory and is confused      ED Treatments / Results  Labs (all labs ordered are listed, but only abnormal results are displayed) Labs Reviewed  BASIC METABOLIC PANEL - Abnormal; Notable for the following components:      Result Value   BUN 24 (*)    All other components within normal limits  CBC    EKG None  Radiology Dg Elbow Complete Left  Result Date: 10/14/2018 CLINICAL DATA:  Pt BIB EMS from memory care unit at [facility]. Pt c/o unwitnessed fall. Pt has laceration to  right posterior humerus and left elbow. Pt has hx of dementia. EXAM: LEFT ELBOW - COMPLETE 3+ VIEW COMPARISON:  None. FINDINGS: No acute fracture or subluxation. Soft tissue irregularity along the ulnar aspect of the elbow is consistent with  laceration. No associated radiopaque foreign body. IMPRESSION: 1. No acute osseous abnormality. 2. Soft tissue injury. Electronically Signed   By: Nolon Nations M.D.   On: 10/14/2018 14:23   Ct Head Wo Contrast  Result Date: 10/14/2018 CLINICAL DATA:  Head and cervical spine trauma. EXAM: CT HEAD WITHOUT CONTRAST CT CERVICAL SPINE WITHOUT CONTRAST TECHNIQUE: Multidetector CT imaging of the head and cervical spine was performed following the standard protocol without intravenous contrast. Multiplanar CT image reconstructions of the cervical spine were also generated. COMPARISON:  July 30, 2018 FINDINGS: CT HEAD FINDINGS Brain: No evidence of acute infarction, hemorrhage, hydrocephalus, extra-axial collection or mass lesion/mass effect. Moderate brain parenchymal volume loss and deep white matter microangiopathy. Vascular: Calcific atherosclerotic disease of the intra cavernous carotid arteries. Skull: Normal. Negative for fracture or focal lesion. Sinuses/Orbits: Complete opacification of the right mastoid air cells. The paranasal sinuses and left mastoid air cells are clear. Other: None. CT CERVICAL SPINE FINDINGS Alignment: Grade 1 anterolisthesis of C3 on C4 and C4 on C5. Minimal posterior listhesis of C6 on C7. Multilevel osteoarthritic changes, stable. Skull base and vertebrae: No acute fracture. No primary bone lesion or focal pathologic process. Soft tissues and spinal canal: No prevertebral fluid or swelling. No visible canal hematoma. Disc levels: Sclerotic remodeling of C6 and C7, likely degenerative. Moderate posterior facet arthropathy. Upper chest: Pleural and subpleural linear thickening. Other: None. IMPRESSION: 1. No acute intracranial abnormality. 2.  Atrophy, chronic microvascular disease. 3. No evidence of acute traumatic injury to the cervical spine. 4. Multilevel osteoarthritic changes of the cervical spine with grade 1 anterolisthesis of C3 on C4 and C4 on C5, and minimal posterior listhesis of C6 on C7 likely secondary to posterior facet disease, stable. Electronically Signed   By: Fidela Salisbury M.D.   On: 10/14/2018 14:13   Ct Cervical Spine Wo Contrast  Result Date: 10/14/2018 CLINICAL DATA:  Head and cervical spine trauma. EXAM: CT HEAD WITHOUT CONTRAST CT CERVICAL SPINE WITHOUT CONTRAST TECHNIQUE: Multidetector CT imaging of the head and cervical spine was performed following the standard protocol without intravenous contrast. Multiplanar CT image reconstructions of the cervical spine were also generated. COMPARISON:  July 30, 2018 FINDINGS: CT HEAD FINDINGS Brain: No evidence of acute infarction, hemorrhage, hydrocephalus, extra-axial collection or mass lesion/mass effect. Moderate brain parenchymal volume loss and deep white matter microangiopathy. Vascular: Calcific atherosclerotic disease of the intra cavernous carotid arteries. Skull: Normal. Negative for fracture or focal lesion. Sinuses/Orbits: Complete opacification of the right mastoid air cells. The paranasal sinuses and left mastoid air cells are clear. Other: None. CT CERVICAL SPINE FINDINGS Alignment: Grade 1 anterolisthesis of C3 on C4 and C4 on C5. Minimal posterior listhesis of C6 on C7. Multilevel osteoarthritic changes, stable. Skull base and vertebrae: No acute fracture. No primary bone lesion or focal pathologic process. Soft tissues and spinal canal: No prevertebral fluid or swelling. No visible canal hematoma. Disc levels: Sclerotic remodeling of C6 and C7, likely degenerative. Moderate posterior facet arthropathy. Upper chest: Pleural and subpleural linear thickening. Other: None. IMPRESSION: 1. No acute intracranial abnormality. 2. Atrophy, chronic microvascular disease.  3. No evidence of acute traumatic injury to the cervical spine. 4. Multilevel osteoarthritic changes of the cervical spine with grade 1 anterolisthesis of C3 on C4 and C4 on C5, and minimal posterior listhesis of C6 on C7 likely secondary to posterior facet disease, stable. Electronically Signed   By: Fidela Salisbury M.D.   On: 10/14/2018 14:13   Dg Chest Portable 1 View  Result Date: 10/14/2018 CLINICAL DATA:  Right arm and left elbow lacerations following a fall. EXAM: PORTABLE CHEST 1 VIEW COMPARISON:  02/15/2018 FINDINGS: Normal sized heart. Stable biapical pleural and parenchymal scarring. Two small ill-defined areas of increased density in the right lower lung zone, possibly representing healing rib fractures. No acute fracture or pneumothorax seen. Left axillary surgical clips and mild scoliosis are stable. IMPRESSION: Two small ill-defined areas of increased density in the right lower lung zone, possibly representing healing rib fractures. Lung nodules or small focal infiltrates are also possible. Electronically Signed   By: Claudie Revering M.D.   On: 10/14/2018 14:25   Dg Humerus Right  Result Date: 10/14/2018 CLINICAL DATA:  Pt BIB EMS from memory care unit at [facility]. Pt c/o unwitnessed fall. Pt has laceration to right posterior humerus and left elbow. Pt has hx of dementia. EXAM: RIGHT HUMERUS - 2+ VIEW COMPARISON:  07/30/2018 RIGHT elbow, 12/13/2017 RIGHT humerus FINDINGS: There is soft tissue irregularity along the distal aspect of the UPPER arm. No acute fracture or subluxation. No radiopaque foreign body. RIGHT lung apex is unremarkable. Degenerative changes at the acromioclavicular joint. IMPRESSION: 1. Soft tissue injury. 2. No radiopaque foreign body. Electronically Signed   By: Nolon Nations M.D.   On: 10/14/2018 14:22    Procedures Procedures (including critical care time)  Medications Ordered in ED Medications  lidocaine-EPINEPHrine (XYLOCAINE W/EPI) 2 %-1:200000 (PF)  injection 10 mL (has no administration in time range)     Initial Impression / Assessment and Plan / ED Course  I have reviewed the triage vital signs and the nursing notes.  Pertinent labs & imaging results that were available during my care of the patient were reviewed by me and considered in my medical decision making (see chart for details).  Clinical Course as of Oct 13 1556  Sat Oct 14, 2018  1451 Labs reviewed.  No significant abnormalities.  X-rays without signs of fracture or or other acute injury.  Questionable nodule versus infiltrate noted on the x-ray.  Patient is asymptomatic.  I doubt infectious etiology.   [JK]    Clinical Course User Index [JK] Dorie Rank, MD     Pt s/p unwitnessed fall.  Suspect mechanical fall.  No signs to suggest systemic illness.  No findings to suggest stroke.   Laceration repaired by PA Alroy Bailiff.   Stable for discharge back to nursing facility.  Tetanus given in 2019  Final Clinical Impressions(s) / ED Diagnoses   Final diagnoses:  Fall, initial encounter  Laceration of right upper extremity, initial encounter  Skin tear of left elbow without complication, initial encounter    ED Discharge Orders    None       Dorie Rank, MD 10/14/18 1558

## 2020-01-04 IMAGING — CT CT CERVICAL SPINE W/O CM
4 of 7 series · 14 of 33 positions shown, 15 images · non-contrast
Comparison: 07/15/2017 head CT. 03/26/2017 head CT and cervical
spine CT.

CLINICAL DATA: 89-year-old female with dementia fell hitting head.
No reported loss of consciousness. Initial encounter.

EXAM:
CT HEAD WITHOUT CONTRAST
CT CERVICAL SPINE WITHOUT CONTRAST
TECHNIQUE: Multidetector CT imaging of the head and cervical spine was
performed following the standard protocol without intravenous
contrast. Multiplanar CT image reconstructions of the cervical spine
were also generated.

[Series 9: c_spine 2.0 st · axial · 0.30mm/px · z∈[-214,-118]mm · 4 of 79 slices shown, 5 images]
[im 16/79  soft-tissue]
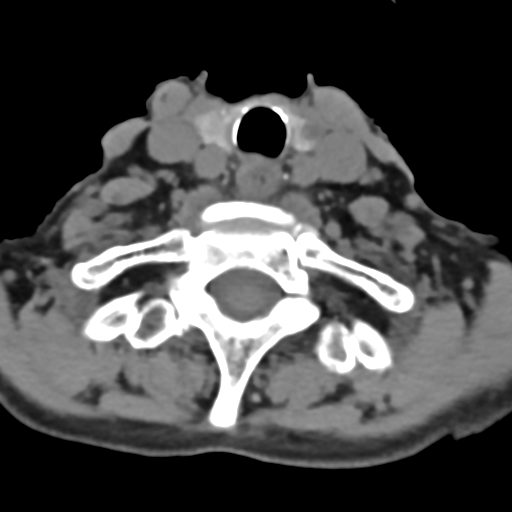
[im 16/79  bone]
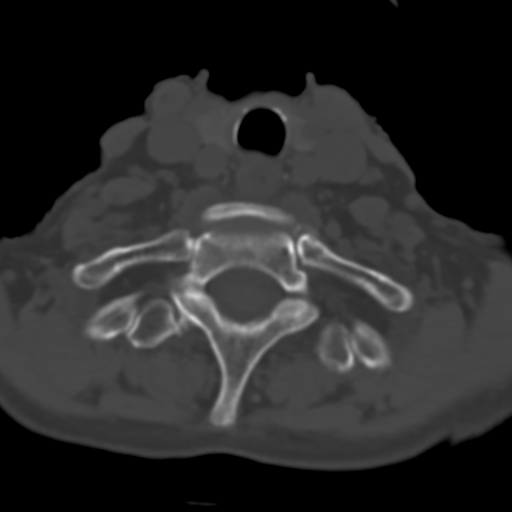
[im 32/79  bone]
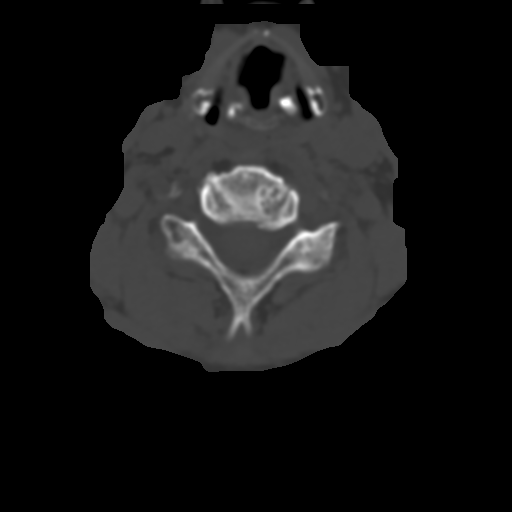
[im 47/79  bone]
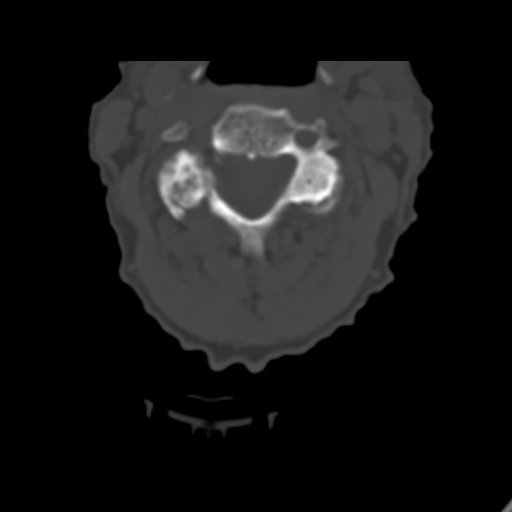
[im 63/79  bone]
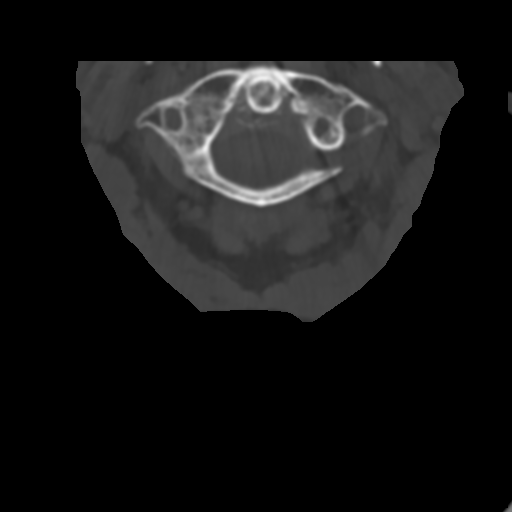

[Series 10: coronal bone · coronal · 0.23mm/px · 1 of 61 slices shown]
[im 31/61  bone]
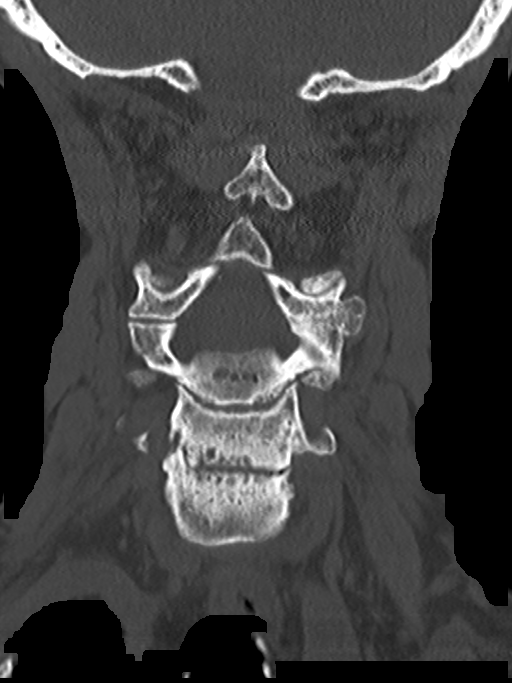

[Series 11: sagittal bone · sagittal · 0.23mm/px · 5 of 61 slices shown]
[im 11/61  bone]
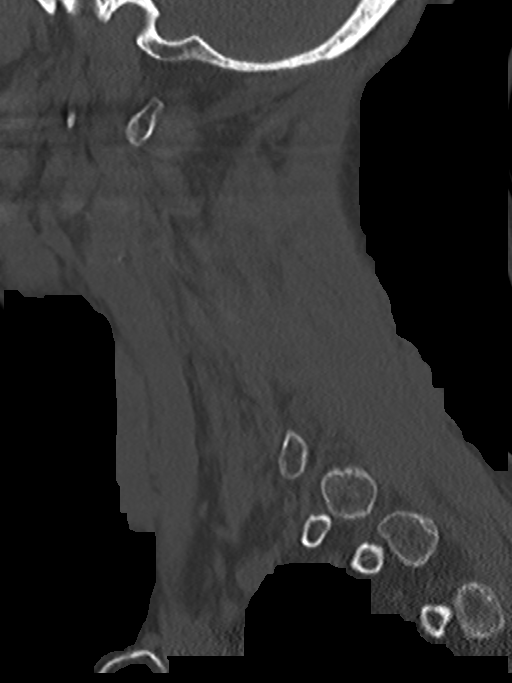
[im 21/61  bone]
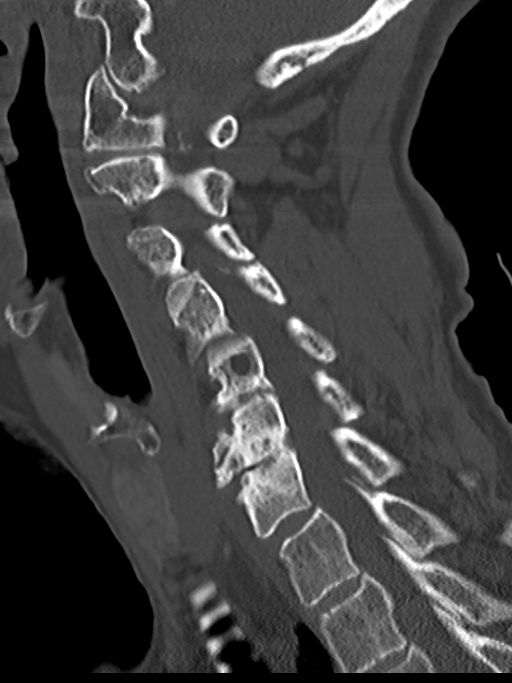
[im 31/61  bone]
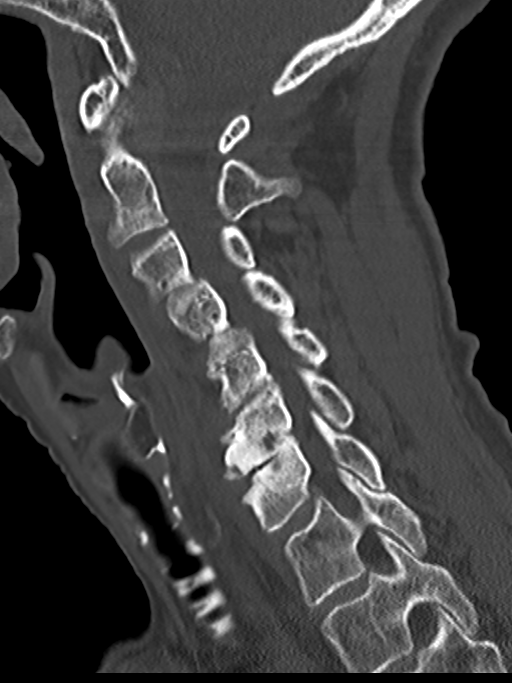
[im 41/61  bone]
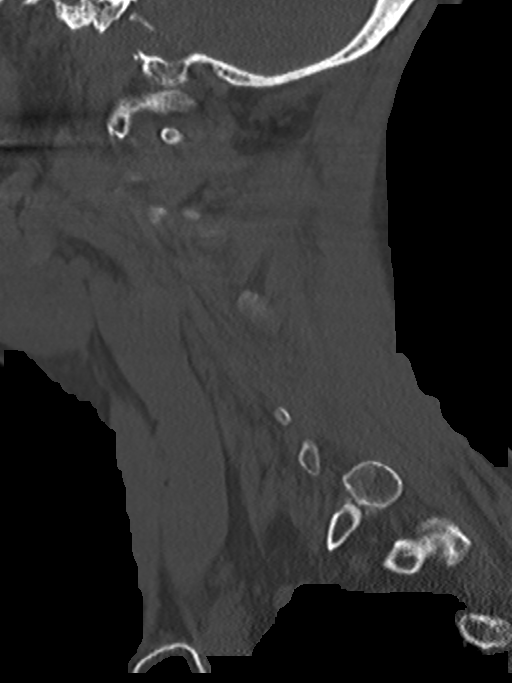
[im 51/61  bone]
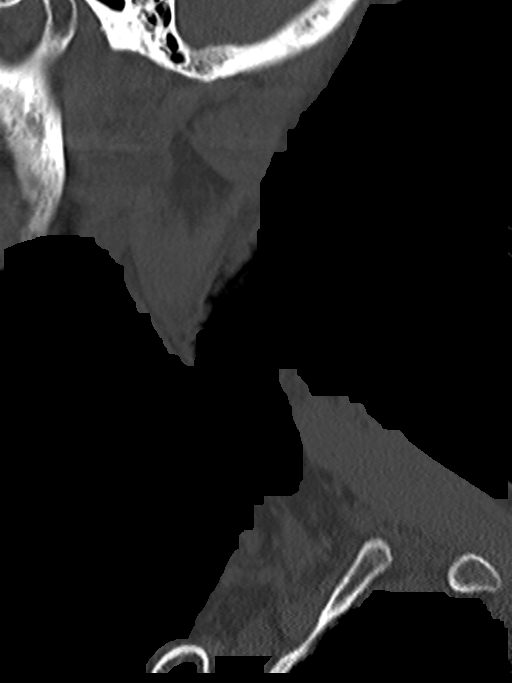

[Series 13: orthogonal axial st · axial · 0.21mm/px · z∈[-232,-129]mm · 4 of 86 slices shown]
[im 18/86  bone]
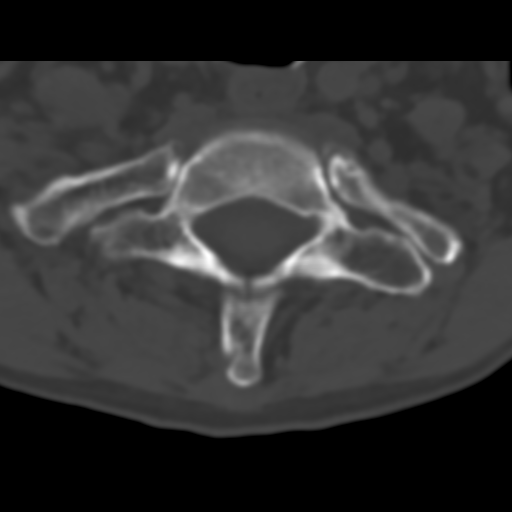
[im 35/86  bone]
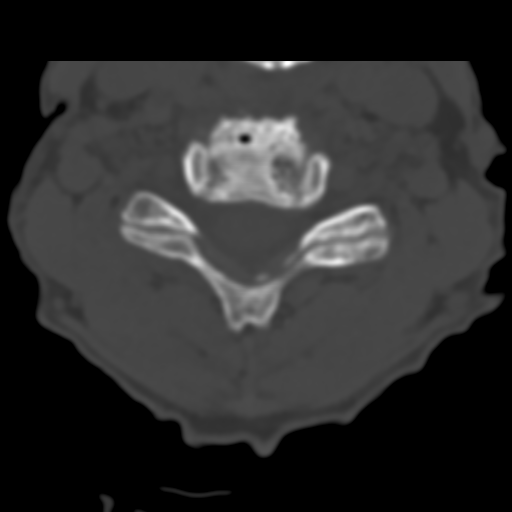
[im 52/86  bone]
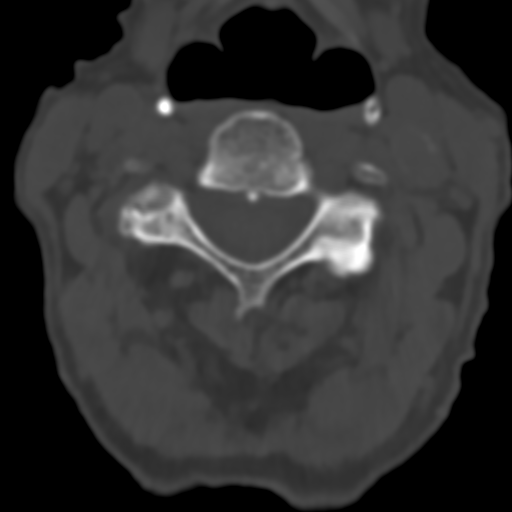
[im 69/86  bone]
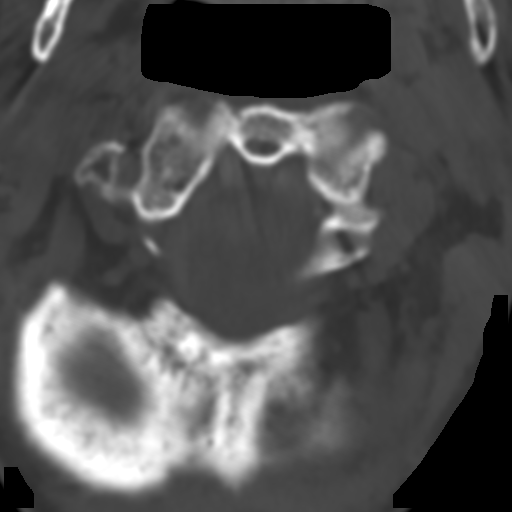

[14 of 33 positions shown; findings below may reference images not displayed]

FINDINGS: CT HEAD FINDINGS

Brain: No intracranial hemorrhage or CT evidence of large acute
infarct. Chronic microvascular changes. Global atrophy. No
intracranial mass lesion noted on this unenhanced exam.

Vascular: Vascular calcifications

Skull: No skull fracture

Sinuses/Orbits: Post lens replacement. No acute orbital abnormality.
Partial opacification small left sphenoid sinus.

Other: Opacification right mastoid air cells. No obstructing lesion
of eustachian tube. Right parietal scalp hematoma.

CT CERVICAL SPINE FINDINGS

Alignment: Mild anterior slip C3 and C4 similar to prior exam.

Skull base and vertebrae: No cervical spine fracture. Prominent
cervical spondylotic changes C5-6 and C6-7 with surrounding
sclerosis.

Soft tissues and spinal canal: No abnormal prevertebral soft tissue
swelling. Cervical spondylotic changes with various degrees of
spinal stenosis and foraminal narrowing without significant cord
compression.

Disc levels: Transverse ligament hypertrophy with mild narrowing
ventral thecal sac.

Facet degenerative changes C3-4 with minimal anterior slip C3
minimal bulge.

Fused left C4-5 facet joint. Minimal anterior slip C4. Minimal
bulge.

C5-6 prominent discogenic changes and left facet degenerative
changes with mild bulge and ventral thecal sac narrowing.

C6-7 prominent discogenic changes. Mild bulge. Mild narrowing
ventral thecal sac.

Upper chest: Scarring lung apices.

Other: Carotid bifurcation calcifications. Minimal nodularity
thyroid gland without dominant mass.
IMPRESSION: Head CT without contrast:

1. Right parietal scalp hematoma. No underlying skull fracture or
intracranial hemorrhage.
2. Chronic microvascular changes.  Atrophy.
3. Opacification right mastoid air cells without obstructing lesion
of eustachian tube noted.

Cervical spine CT:

1. Alignment similar to prior exam. No cervical spine fracture or
abnormal prevertebral soft tissue swelling noted.
2. Multilevel cervical spondylotic changes similar to prior exam
without high-grade spinal stenosis.

## 2020-02-18 ENCOUNTER — Other Ambulatory Visit: Payer: Self-pay

## 2020-02-18 ENCOUNTER — Encounter (HOSPITAL_BASED_OUTPATIENT_CLINIC_OR_DEPARTMENT_OTHER): Payer: Self-pay

## 2020-02-18 ENCOUNTER — Emergency Department (HOSPITAL_BASED_OUTPATIENT_CLINIC_OR_DEPARTMENT_OTHER)
Admission: EM | Admit: 2020-02-18 | Discharge: 2020-02-19 | Disposition: A | Discharge status: Home / Self Care | Payer: Medicare Other | Attending: Emergency Medicine | Admitting: Emergency Medicine

## 2020-02-18 DIAGNOSIS — Y92129 Unspecified place in nursing home as the place of occurrence of the external cause: Secondary | ICD-10-CM | POA: Diagnosis not present

## 2020-02-18 DIAGNOSIS — S81812A Laceration without foreign body, left lower leg, initial encounter: Secondary | ICD-10-CM | POA: Insufficient documentation

## 2020-02-18 DIAGNOSIS — I10 Essential (primary) hypertension: Secondary | ICD-10-CM | POA: Diagnosis not present

## 2020-02-18 DIAGNOSIS — W2209XA Striking against other stationary object, initial encounter: Secondary | ICD-10-CM | POA: Diagnosis not present

## 2020-02-18 DIAGNOSIS — S81819A Laceration without foreign body, unspecified lower leg, initial encounter: Secondary | ICD-10-CM

## 2020-02-18 DIAGNOSIS — Z79899 Other long term (current) drug therapy: Secondary | ICD-10-CM | POA: Diagnosis not present

## 2020-02-18 DIAGNOSIS — F0390 Unspecified dementia without behavioral disturbance: Secondary | ICD-10-CM | POA: Diagnosis not present

## 2020-02-18 MED ORDER — BACITRACIN ZINC 500 UNIT/GM EX OINT: TOPICAL_OINTMENT | Freq: Two times a day (BID) | CUTANEOUS | Status: DC

## 2020-02-18 NOTE — ED Provider Notes (Signed)
[facility] EMERGENCY DEPARTMENT Provider Note   CSN: [id number] Arrival date & time: 02/18/20  2214     History Chief Complaint  Patient presents with  . Laceration    Sustained deep laceration to left calf after running into her walker.    Amanda Randall is a 87 y.o. female.  Patient presents with laceration skin tear to the left calf.  Reportedly sustained earlier today at her facility.  Patient has a history of dementia unable to obtain review of systems or complete history from patient herself.  No reports of fevers or cough or vomiting or diarrhea or fall.        Past Medical History:  Diagnosis Date  . Dementia ([place])   . Hypertension     There are no problems to display for this patient.   No past surgical history on file.   OB History   No obstetric history on file.     History reviewed. No pertinent family history.  Social History   Tobacco Use  . Smoking status: Never Smoker  . Smokeless tobacco: Never Used  Vaping Use  . Vaping Use: Never used  Substance Use Topics  . Alcohol use: No  . Drug use: No    Home Medications Prior to Admission medications   Medication Sig Start Date End Date Taking? Authorizing Provider  citalopram (CELEXA) 20 MG tablet Take 20 mg by mouth daily.    [provider]  diazepam (VALIUM) 2 MG tablet Take 2 mg by mouth 2 (two) times daily.    [provider]  ENSURE (ENSURE) Take 1 Can by mouth 2 (two) times daily between meals.    [provider]  memantine (NAMENDA) 10 MG tablet Take 10 mg by mouth 2 (two) times daily.    [provider]  metoprolol tartrate (LOPRESSOR) 25 MG tablet Take 12.5 mg by mouth 2 (two) times daily.     [provider]  mirtazapine (REMERON) 7.5 MG tablet Take 7.5 mg by mouth at bedtime.    [provider]  polyethylene glycol (MIRALAX / GLYCOLAX) packet Take 17 g by mouth daily.    [provider]  rivastigmine (EXELON) 3  MG capsule Take 3 mg by mouth 2 (two) times daily.    [provider]  vitamin B-12 (CYANOCOBALAMIN) 1000 MCG tablet Take 1,000 mcg by mouth daily.    [provider]    Allergies    Patient has no known allergies.  Review of Systems   Review of Systems  Constitutional: Negative for fever.  HENT: Negative for ear pain.   Eyes: Negative for pain.  Respiratory: Negative for cough.   Cardiovascular: Negative for chest pain.  Gastrointestinal: Negative for abdominal pain.  Genitourinary: Negative for flank pain.  Musculoskeletal: Negative for back pain.  Skin: Negative for rash.  Neurological: Negative for headaches.    Physical Exam Updated Vital Signs BP (!) 163/102 (BP Location: Right Arm)   Pulse 60   Temp 97.9 F (36.6 C) (Oral)   Resp 14   Wt 54.4 kg   SpO2 100%   BMI 23.44 kg/m   Physical Exam Constitutional:      General: She is not in acute distress.    Appearance: Normal appearance.  HENT:     Head: Normocephalic.     Nose: Nose normal.  Eyes:     Extraocular Movements: Extraocular movements intact.  Cardiovascular:     Rate and Rhythm: Normal rate.  Pulmonary:  Effort: Pulmonary effort is normal.  Musculoskeletal:        General: Normal range of motion.     Cervical back: Normal range of motion.  Skin:    Comments: Left lower extremity medial aspect of leg has a 4 x 3 cm skin tear.    Neurological:     General: No focal deficit present.     Mental Status: She is alert. Mental status is at baseline.     ED Results / Procedures / Treatments   Labs (all labs ordered are listed, but only abnormal results are displayed) Labs Reviewed - No data to display  EKG None  Radiology No results found.  Procedures Procedures (including critical care time)  Medications Ordered in ED Medications  bacitracin ointment ( Topical Given 02/18/20 2324)    ED Course  I have reviewed the triage vital signs and the nursing  notes.  Pertinent labs & imaging results that were available during my care of the patient were reviewed by me and considered in my medical decision making (see chart for details).    MDM Rules/Calculators/A&P                          Wound on the leg does not appear amenable for suture repair.  Wound is irrigated and cleansed here in the ER bacitracin ointment is placed and dressing placed.  Advised daily dressing changes and to keep the wound clean.  Advised return for signs of infection cellulitis or any additional concerns.   Final Clinical Impression(s) / ED Diagnoses Final diagnoses:  Noninfected skin tear of lower extremity, initial encounter    Rx / DC Orders ED Discharge Orders    None       Luna Fuse, MD 02/18/20 2328

## 2020-02-18 NOTE — ED Notes (Signed)
Pt on ems stretcher awaiting bed.

## 2020-02-18 NOTE — Discharge Instructions (Addendum)
Change dressings daily.  Keep the wound dry and clean.  Apply bacitracin ointment to the wound.   Return immediately back to the ER if:  Your symptoms worsen within the next 12-24 hours. If you develop redness purulent drainage fevers or any additional concerns.

## 2020-02-19 NOTE — ED Notes (Signed)
Patient attempting to get out of bed. Reoriented patient. Patient moved into recliner chair in room. Gave patient crackers and water.

## 2020-05-03 ENCOUNTER — Other Ambulatory Visit: Payer: Self-pay

## 2020-05-03 ENCOUNTER — Emergency Department (HOSPITAL_COMMUNITY): Payer: Medicare Other

## 2020-05-03 ENCOUNTER — Inpatient Hospital Stay (HOSPITAL_COMMUNITY)
Admission: EM | Admit: 2020-05-03 | Discharge: 2020-05-08 | DRG: 521 | Disposition: A | Discharge status: Skilled Nursing Facility | Payer: Medicare Other | Attending: Internal Medicine | Admitting: Internal Medicine

## 2020-05-03 DIAGNOSIS — I471 Supraventricular tachycardia: Secondary | ICD-10-CM | POA: Diagnosis not present

## 2020-05-03 DIAGNOSIS — Z853 Personal history of malignant neoplasm of breast: Secondary | ICD-10-CM | POA: Diagnosis not present

## 2020-05-03 DIAGNOSIS — M80051A Age-related osteoporosis with current pathological fracture, right femur, initial encounter for fracture: Principal | ICD-10-CM | POA: Diagnosis present

## 2020-05-03 DIAGNOSIS — I10 Essential (primary) hypertension: Secondary | ICD-10-CM | POA: Diagnosis present

## 2020-05-03 DIAGNOSIS — D509 Iron deficiency anemia, unspecified: Secondary | ICD-10-CM | POA: Diagnosis present

## 2020-05-03 DIAGNOSIS — Z20822 Contact with and (suspected) exposure to covid-19: Secondary | ICD-10-CM | POA: Diagnosis present

## 2020-05-03 DIAGNOSIS — R339 Retention of urine, unspecified: Secondary | ICD-10-CM | POA: Diagnosis not present

## 2020-05-03 DIAGNOSIS — G9341 Metabolic encephalopathy: Secondary | ICD-10-CM | POA: Diagnosis present

## 2020-05-03 DIAGNOSIS — I4510 Unspecified right bundle-branch block: Secondary | ICD-10-CM | POA: Diagnosis present

## 2020-05-03 DIAGNOSIS — D62 Acute posthemorrhagic anemia: Secondary | ICD-10-CM | POA: Diagnosis not present

## 2020-05-03 DIAGNOSIS — Z9012 Acquired absence of left breast and nipple: Secondary | ICD-10-CM | POA: Diagnosis not present

## 2020-05-03 DIAGNOSIS — Z8249 Family history of ischemic heart disease and other diseases of the circulatory system: Secondary | ICD-10-CM

## 2020-05-03 DIAGNOSIS — F0390 Unspecified dementia without behavioral disturbance: Secondary | ICD-10-CM

## 2020-05-03 DIAGNOSIS — Z79899 Other long term (current) drug therapy: Secondary | ICD-10-CM

## 2020-05-03 DIAGNOSIS — Z681 Body mass index (BMI) 19 or less, adult: Secondary | ICD-10-CM

## 2020-05-03 DIAGNOSIS — S72001D Fracture of unspecified part of neck of right femur, subsequent encounter for closed fracture with routine healing: Secondary | ICD-10-CM | POA: Diagnosis not present

## 2020-05-03 DIAGNOSIS — I440 Atrioventricular block, first degree: Secondary | ICD-10-CM | POA: Diagnosis present

## 2020-05-03 DIAGNOSIS — D6959 Other secondary thrombocytopenia: Secondary | ICD-10-CM | POA: Diagnosis not present

## 2020-05-03 DIAGNOSIS — J9601 Acute respiratory failure with hypoxia: Secondary | ICD-10-CM | POA: Diagnosis not present

## 2020-05-03 DIAGNOSIS — S72009A Fracture of unspecified part of neck of unspecified femur, initial encounter for closed fracture: Secondary | ICD-10-CM | POA: Diagnosis present

## 2020-05-03 DIAGNOSIS — E43 Unspecified severe protein-calorie malnutrition: Secondary | ICD-10-CM | POA: Insufficient documentation

## 2020-05-03 DIAGNOSIS — S72001A Fracture of unspecified part of neck of right femur, initial encounter for closed fracture: Secondary | ICD-10-CM

## 2020-05-03 DIAGNOSIS — T40415A Adverse effect of fentanyl or fentanyl analogs, initial encounter: Secondary | ICD-10-CM | POA: Diagnosis not present

## 2020-05-03 DIAGNOSIS — M25551 Pain in right hip: Secondary | ICD-10-CM | POA: Diagnosis present

## 2020-05-03 HISTORY — DX: Iron deficiency anemia, unspecified: D50.9

## 2020-05-03 HISTORY — DX: Malignant neoplasm of unspecified site of unspecified female breast: C50.919

## 2020-05-03 LAB — CBC WITH DIFFERENTIAL/PLATELET
Abs Immature Granulocytes: 0.06 10*3/uL (ref 0.00–0.07)
Basophils Absolute: 0 10*3/uL (ref 0.0–0.1)
Basophils Relative: 0 %
Eosinophils Absolute: 0 10*3/uL (ref 0.0–0.5)
Eosinophils Relative: 0 %
HCT: 47.4 % — ABNORMAL HIGH (ref 36.0–46.0)
Hemoglobin: 15.2 g/dL — ABNORMAL HIGH (ref 12.0–15.0)
Immature Granulocytes: 1 %
Lymphocytes Relative: 5 %
Lymphs Abs: 0.6 10*3/uL — ABNORMAL LOW (ref 0.7–4.0)
MCH: 28.1 pg (ref 26.0–34.0)
MCHC: 32.1 g/dL (ref 30.0–36.0)
MCV: 87.6 fL (ref 80.0–100.0)
Monocytes Absolute: 0.4 10*3/uL (ref 0.1–1.0)
Monocytes Relative: 4 %
Neutro Abs: 10.6 10*3/uL — ABNORMAL HIGH (ref 1.7–7.7)
Neutrophils Relative %: 90 %
Platelets: 187 10*3/uL (ref 150–400)
RBC: 5.41 MIL/uL — ABNORMAL HIGH (ref 3.87–5.11)
RDW: 13.3 % (ref 11.5–15.5)
WBC: 11.7 10*3/uL — ABNORMAL HIGH (ref 4.0–10.5)
nRBC: 0 % (ref 0.0–0.2)

## 2020-05-03 LAB — BASIC METABOLIC PANEL
Anion gap: 10 (ref 5–15)
BUN: 22 mg/dL (ref 8–23)
CO2: 27 mmol/L (ref 22–32)
Calcium: 9.3 mg/dL (ref 8.9–10.3)
Chloride: 102 mmol/L (ref 98–111)
Creatinine, Ser: 0.85 mg/dL (ref 0.44–1.00)
GFR, Estimated: >60 mL/min (ref >60)
Glucose, Bld: 111 mg/dL — ABNORMAL HIGH (ref 70–99)
Potassium: 4.2 mmol/L (ref 3.5–5.1)
Sodium: 139 mmol/L (ref 135–145)

## 2020-05-03 LAB — TYPE AND SCREEN
ABO/RH(D): A NEG
Antibody Screen: NEGATIVE

## 2020-05-03 LAB — RESP PANEL BY RT-PCR (FLU A&B, COVID) ARPGX2
Influenza A by PCR: NEGATIVE
Influenza B by PCR: NEGATIVE
SARS Coronavirus 2 by RT PCR: NEGATIVE

## 2020-05-03 LAB — PROTIME-INR
INR: 1 (ref 0.8–1.2)
Prothrombin Time: 13.2 seconds (ref 11.4–15.2)

## 2020-05-03 LAB — SURGICAL PCR SCREEN
MRSA, PCR: NEGATIVE
Staphylococcus aureus: NEGATIVE

## 2020-05-03 LAB — ABO/RH: ABO/RH(D): A NEG

## 2020-05-03 MED ORDER — CEFAZOLIN SODIUM-DEXTROSE 2-4 GM/100ML-% IV SOLN
2.0000 g | INTRAVENOUS | Status: AC
  Administered 2020-05-04: 2 g via INTRAVENOUS
  Filled 2020-05-03: qty 100

## 2020-05-03 MED ORDER — POVIDONE-IODINE 10 % EX SWAB: 2.0000 "application " | Freq: Once | CUTANEOUS | Status: DC

## 2020-05-03 MED ORDER — RIVASTIGMINE TARTRATE 3 MG PO CAPS
3.0000 mg | ORAL_CAPSULE | Freq: Two times a day (BID) | ORAL | Status: DC
  Administered 2020-05-03 – 2020-05-08 (×9): 3 mg via ORAL
  Filled 2020-05-03 (×11): qty 1

## 2020-05-03 MED ORDER — HYDROCODONE-ACETAMINOPHEN 5-325 MG PO TABS
1.0000 | ORAL_TABLET | Freq: Four times a day (QID) | ORAL | Status: DC | PRN
Start: 2020-05-03 — End: 2020-05-09
  Administered 2020-05-04 – 2020-05-05 (×4): 1 via ORAL
  Filled 2020-05-03 (×4): qty 1

## 2020-05-03 MED ORDER — TRANEXAMIC ACID-NACL 1000-0.7 MG/100ML-% IV SOLN
1000.0000 mg | INTRAVENOUS | Status: AC
  Administered 2020-05-04: 1000 mg via INTRAVENOUS
  Filled 2020-05-03: qty 100

## 2020-05-03 MED ORDER — DEXTROSE 5 % IV SOLN: 3.0000 g | INTRAVENOUS | Status: DC

## 2020-05-03 MED ORDER — ENOXAPARIN SODIUM 30 MG/0.3ML ~~LOC~~ SOLN
30.0000 mg | Freq: Once | SUBCUTANEOUS | Status: AC
  Administered 2020-05-03: 30 mg via SUBCUTANEOUS
  Filled 2020-05-03: qty 0.3

## 2020-05-03 MED ORDER — HEPARIN SODIUM (PORCINE) 5000 UNIT/ML IJ SOLN: 5000.0000 [IU] | Freq: Three times a day (TID) | INTRAMUSCULAR | Status: DC

## 2020-05-03 MED ORDER — HYDROMORPHONE HCL 1 MG/ML IJ SOLN: 0.5000 mg | INTRAMUSCULAR | Status: DC | PRN

## 2020-05-03 MED ORDER — CHLORHEXIDINE GLUCONATE 4 % EX LIQD
60.0000 mL | Freq: Once | CUTANEOUS | Status: DC
  Filled 2020-05-03: qty 60

## 2020-05-03 MED ORDER — METOPROLOL TARTRATE 25 MG PO TABS: 12.5000 mg | ORAL_TABLET | Freq: Two times a day (BID) | ORAL | Status: DC

## 2020-05-03 MED ORDER — POLYETHYLENE GLYCOL 3350 17 G PO PACK
17.0000 g | PACK | Freq: Every day | ORAL | Status: DC
  Administered 2020-05-05 – 2020-05-08 (×3): 17 g via ORAL
  Filled 2020-05-03 (×4): qty 1

## 2020-05-03 MED ORDER — FENTANYL CITRATE (PF) 100 MCG/2ML IJ SOLN
50.0000 ug | INTRAMUSCULAR | Status: DC | PRN
  Administered 2020-05-03: 50 ug via INTRAVENOUS
  Filled 2020-05-03: qty 2

## 2020-05-03 MED ORDER — MIRTAZAPINE 15 MG PO TABS
7.5000 mg | ORAL_TABLET | Freq: Every day | ORAL | Status: DC
  Administered 2020-05-03 – 2020-05-08 (×6): 7.5 mg via ORAL
  Filled 2020-05-03 (×6): qty 1

## 2020-05-03 MED ORDER — METOPROLOL TARTRATE 25 MG PO TABS
12.5000 mg | ORAL_TABLET | Freq: Two times a day (BID) | ORAL | Status: DC
  Administered 2020-05-03 – 2020-05-08 (×9): 12.5 mg via ORAL
  Filled 2020-05-03 (×9): qty 1

## 2020-05-03 MED ORDER — CITALOPRAM HYDROBROMIDE 20 MG PO TABS
20.0000 mg | ORAL_TABLET | Freq: Every day | ORAL | Status: DC
  Administered 2020-05-05 – 2020-05-08 (×4): 20 mg via ORAL
  Filled 2020-05-03 (×3): qty 1
  Filled 2020-05-03: qty 2
  Filled 2020-05-03: qty 1

## 2020-05-03 MED ORDER — MEMANTINE HCL 10 MG PO TABS
10.0000 mg | ORAL_TABLET | Freq: Two times a day (BID) | ORAL | Status: DC
  Administered 2020-05-03 – 2020-05-08 (×10): 10 mg via ORAL
  Filled 2020-05-03 (×4): qty 1
  Filled 2020-05-03 (×2): qty 2
  Filled 2020-05-03 (×5): qty 1

## 2020-05-03 NOTE — ED Triage Notes (Signed)
Per EMS [facility] memory care. Woke no pain then around lunch pt made staff aware of hip pain and staff assisted pt back to room. Per staff pt was not left unsupervised for long periods of time and pt can not remember what happed. Pt has sever dementia and only knows her name.  139/95 P 59 O2 94 RA R16  Shorting of right side with no rotation  Pt just states pain in rt hip.  Brother may be here soon he is POA

## 2020-05-03 NOTE — H&P (Signed)
History and Physical        Hospital Admission Note Date: 05/03/2020  Patient name: Amanda Randall Medical record number: [id number] Date of birth: 02/11/1927 Age: 87 y.o. Gender: female  PCP: Cloward, Dianna Rossetti, MD  Patient coming from: Amanda Randall   Chief Complaint    Chief Complaint  Patient presents with  . Hip Pain      HPI:   History obtained from brother at bedside and prior notes as patient has advanced dementia and is a poor historian  This is a 87 year old female with past medical history of advanced dementia, hypertension from [facility] who presented to the ED with complaints of right hip pain.  Patient was in her normal state of health this morning until around lunchtime when she started complaining of right hip pain.  Brother says that she is supposed to be using a walker but she has not been lately.  Apparently she was able to walk back to her room.  No known falls.   ED Course: Afebrile, hemodynamically stable, hypoxic (SpO2 89% on room air) placed on 2 L/min. Notable Labs: Sodium 139, K4.2, BUN 22, creatinine 0.85, WBC 11.7, Hb 15.2, platelets 187, Covid pending. Notable Imaging: Hip XR-acute impacted right subcapital femoral neck fracture. Patient received fentanyl.  ED provider discussed with Dr. Lyla Glassing, Ortho.   Vitals:   05/03/20 1315 05/03/20 1329  BP: (!) 141/129   Pulse: 94   Resp: 20   Temp:    SpO2: (!) 84% (!) 89%     Review of Systems:  Review of Systems  Unable to perform ROS: Dementia    Medical/Social/Family History   Past Medical History: Past Medical History:  Diagnosis Date  . Dementia ([place])   . Hypertension     No past surgical history on file.  Medications: Prior to Admission medications   Medication Sig Start Date End Date Taking? Authorizing Provider  citalopram (CELEXA) 20 MG tablet Take 20 mg by mouth daily.     [provider]  diazepam (VALIUM) 2 MG tablet Take 2 mg by mouth 2 (two) times daily.    [provider]  ENSURE (ENSURE) Take 1 Can by mouth 2 (two) times daily between meals.    [provider]  memantine (NAMENDA) 10 MG tablet Take 10 mg by mouth 2 (two) times daily.    [provider]  metoprolol tartrate (LOPRESSOR) 25 MG tablet Take 12.5 mg by mouth 2 (two) times daily.     [provider]  mirtazapine (REMERON) 7.5 MG tablet Take 7.5 mg by mouth at bedtime.    [provider]  polyethylene glycol (MIRALAX / GLYCOLAX) packet Take 17 g by mouth daily.    [provider]  rivastigmine (EXELON) 3 MG capsule Take 3 mg by mouth 2 (two) times daily.    [provider]  vitamin B-12 (CYANOCOBALAMIN) 1000 MCG tablet Take 1,000 mcg by mouth daily.    [provider]    Allergies:  No Known Allergies  Social History:  reports that she has never smoked. She has never used smokeless tobacco. She reports that she does not drink alcohol and does not use drugs.  Family History: No  family history on file.   Objective   Physical Exam: Blood pressure (!) 141/129, pulse 94, temperature 97.6 F (36.4 C), temperature source Oral, resp. rate 20, SpO2 (!) 89 %.  Physical Exam Vitals and nursing note reviewed.  Constitutional:      Appearance: Normal appearance.  HENT:     Head: Normocephalic and atraumatic.  Eyes:     Conjunctiva/sclera: Conjunctivae normal.  Cardiovascular:     Rate and Rhythm: Normal rate and regular rhythm.  Pulmonary:     Effort: Pulmonary effort is normal.     Breath sounds: Normal breath sounds.  Abdominal:     General: Abdomen is flat.     Palpations: Abdomen is soft.  Musculoskeletal:     Comments: RLE externally rotated  Skin:    Coloration: Skin is not jaundiced or pale.  Neurological:     Mental Status: She is alert. Mental status is at baseline.  Psychiatric:        Mood  and Affect: Mood normal.        Behavior: Behavior normal.     LABS on Admission: I have personally reviewed all the labs and imaging below    Basic Metabolic Panel: Recent Labs  Lab 05/03/20 1255  NA 139  K 4.2  CL 102  CO2 27  GLUCOSE 111*  BUN 22  CREATININE 0.85  CALCIUM 9.3   Liver Function Tests: No results for input(s): AST, ALT, ALKPHOS, BILITOT, PROT, ALBUMIN in the last 168 hours. No results for input(s): LIPASE, AMYLASE in the last 168 hours. No results for input(s): AMMONIA in the last 168 hours. CBC: Recent Labs  Lab 05/03/20 1255  WBC 11.7*  NEUTROABS 10.6*  HGB 15.2*  HCT 47.4*  MCV 87.6  PLT 187   Cardiac Enzymes: No results for input(s): CKTOTAL, CKMB, CKMBINDEX, TROPONINI in the last 168 hours. BNP: Invalid input(s): POCBNP CBG: No results for input(s): GLUCAP in the last 168 hours.  Radiological Exams on Admission:  DG Hip Unilat With Pelvis 2-3 Views Right  Result Date: 05/03/2020 CLINICAL DATA:  Right hip pain after fall. EXAM: DG HIP (WITH OR WITHOUT PELVIS) 2-3V RIGHT COMPARISON:  None. FINDINGS: There is an acute impacted, subcapital right femoral neck fracture. No dislocation identified. Bilateral hip osteoarthritis. IMPRESSION: Acute, impacted, right subcapital femoral neck fracture. Electronically Signed   By: Kerby Moors M.D.   On: 05/03/2020 11:55      EKG: unchanged from previous tracings, RBBB   A & P   Principal Problem:   Hip fracture (HCC) Active Problems:   Essential hypertension   Dementia without behavioral disturbance ([place])   1. Acute impacted right subcapital femoral neck fracture a. Suspect secondary to unwitnessed and unconfirmed fall b. Leukocytosis likely reactive c. Pain management per hip fracture order set d. Orthopedic surgery consulted, possible surgery tomorrow.  Will make n.p.o. after midnight  2. Episode of hypoxia a. Likely secondary to fentanyl dose, resolved b. Currently tolerating room  air  3. Hypertension a. Continue home Lopressor  4. Advanced dementia a. Continue home Namenda, Celexa, Remeron and rivastigmine b. Holding Valium c. Delirium precautions    DVT prophylaxis: Heparin   Code Status: Full Code  Diet: Heart healthy, n.p.o. after midnight Family Communication: Admission, patients condition and plan of care including tests being ordered have been discussed with the patient who indicates understanding and agrees with the plan and Code Status. Patient's brother was updated  Disposition Plan: The appropriate patient status for this patient is  INPATIENT. Inpatient status is judged to be reasonable and necessary in order to provide the required intensity of service to ensure the patient's safety. The patient's presenting symptoms, physical exam findings, and initial radiographic and laboratory data in the context of their chronic comorbidities is felt to place them at high risk for further clinical deterioration. Furthermore, it is not anticipated that the patient will be medically stable for discharge from the hospital within 2 midnights of admission. The following factors support the patient status of inpatient.   " The patient's presenting symptoms include right hip pain . " The worrisome physical exam findings include externally rotated hip. " The initial radiographic and laboratory data are worrisome because of right femoral neck fracture. " The chronic co-morbidities include hypertension, dementia.   * I certify that at the point of admission it is my clinical judgment that the patient will require inpatient hospital care spanning beyond 2 midnights from the point of admission due to high intensity of service, high risk for further deterioration and high frequency of surveillance required.*   Consultants  . Orthopedic surgery  Procedures  . None  Time Spent on Admission: 53 minutes    Harold Hedge, DO [facility]  05/03/2020, 2:01 PM

## 2020-05-03 NOTE — ED Notes (Signed)
PT resting in bed respirations even and unlabored. Brother at bedside. O2 89, md aware and O2 at 2L

## 2020-05-03 NOTE — ED Notes (Signed)
Pt continues to take b/p cuff and resistant to having vitals checked due to dementia.

## 2020-05-03 NOTE — ED Notes (Signed)
[facility] on[facility].

## 2020-05-03 NOTE — ED Provider Notes (Signed)
[facility] EMERGENCY DEPARTMENT Provider Note  CSN: [id number] Arrival date & time: 05/03/20 1055    History Chief Complaint  Patient presents with  . Hip Pain    HPI  Amanda Randall is a 87 y.o. female brought to the ED via EMS from [facility]. Per EMS report she was in her normal state of health this morning. Around lunch she was complaining of R hip pain but was able to walk back to her room. No known falls. She has dementia and is unable to give any additional history. Level 5 caveat applies.    Past Medical History:  Diagnosis Date  . Dementia ([place])   . Hypertension     No past surgical history on file.  No family history on file.  Social History   Tobacco Use  . Smoking status: Never Smoker  . Smokeless tobacco: Never Used  Vaping Use  . Vaping Use: Never used  Substance Use Topics  . Alcohol use: No  . Drug use: No     Home Medications Prior to Admission medications   Medication Sig Start Date End Date Taking? Authorizing Provider  acetaminophen (TYLENOL) 325 MG tablet Take 650 mg by mouth every 6 (six) hours as needed for mild pain.   Yes [provider]  citalopram (CELEXA) 20 MG tablet Take 20 mg by mouth daily.   Yes [provider]  ENSURE (ENSURE) Take 1 Can by mouth 2 (two) times daily between meals. VANILLA   Yes [provider]  memantine (NAMENDA) 10 MG tablet Take 10 mg by mouth 2 (two) times daily.   Yes [provider]  metoprolol tartrate (LOPRESSOR) 25 MG tablet Take 12.5 mg by mouth 2 (two) times daily. HOLD FOR SYSTOLIC BLOOD PRESSURE LESS THAN OR EQUAL TO 110   Yes [provider]  mirtazapine (REMERON) 15 MG tablet Take 15 mg by mouth at bedtime. 04/09/20  Yes [provider]  Multiple Vitamin (MULTIVITAMIN WITH MINERALS) TABS tablet Take 1 tablet by mouth daily.   Yes [provider]  polyethylene glycol (MIRALAX / GLYCOLAX) packet Take 17 g by mouth daily.   Yes [provider]  rivastigmine (EXELON) 3 MG capsule Take 3 mg by mouth 2 (two) times daily.   Yes [provider]  vitamin B-12 (CYANOCOBALAMIN) 250 MCG tablet Take 250 mcg by mouth daily.   Yes [provider]     Allergies    Patient has no known allergies.   Review of Systems   Review of Systems Unable to assess due to mental status.    Physical Exam BP (!) 141/129 (BP Location: Right Arm)   Pulse 94   Temp 97.6 F (36.4 C) (Oral)   Resp 20   SpO2 (!) 89%   Physical Exam Vitals and nursing note reviewed.  Constitutional:      Appearance: Normal appearance.  HENT:     Head: Normocephalic and atraumatic.     Nose: Nose normal.     Mouth/Throat:     Mouth: Mucous membranes are moist.  Eyes:     Extraocular Movements: Extraocular movements intact.     Conjunctiva/sclera: Conjunctivae normal.  Cardiovascular:     Rate and Rhythm: Normal rate.  Pulmonary:     Effort: Pulmonary effort is normal.     Breath sounds: Normal breath sounds.  Abdominal:     General: Abdomen is flat.     Palpations: Abdomen is soft.     Tenderness:  There is no abdominal tenderness.  Musculoskeletal:        General: Tenderness (R lateral hip) and deformity (mild shortening and external rotation of the R leg) present. No swelling.     Cervical back: Neck supple.     Comments: Pain with ROM of R hip, distally NVI  Skin:    General: Skin is warm and dry.  Neurological:     General: No focal deficit present.     Mental Status: She is alert. Mental status is at baseline. She is disoriented.  Psychiatric:        Mood and Affect: Mood normal.      ED Results / Procedures / Treatments   Labs (all labs ordered are listed, but only abnormal results are displayed) Labs Reviewed  BASIC METABOLIC PANEL - Abnormal; Notable for the following components:      Result Value   Glucose, Bld 111 (*)    All other components within normal limits  CBC WITH DIFFERENTIAL/PLATELET -  Abnormal; Notable for the following components:   WBC 11.7 (*)    RBC 5.41 (*)    Hemoglobin 15.2 (*)    HCT 47.4 (*)    Neutro Abs 10.6 (*)    Lymphs Abs 0.6 (*)    All other components within normal limits  RESP PANEL BY RT-PCR (FLU A&B, COVID) ARPGX2  PROTIME-INR  TYPE AND SCREEN  ABO/RH    EKG EKG Interpretation  Date/Time:  Saturday May 03 2020 12:36:56 EDT Ventricular Rate:  84 PR Interval:    QRS Duration: 124 QT Interval:  409 QTC Calculation: 484 R Axis:   -81 Text Interpretation: Sinus rhythm Prolonged PR interval Right bundle branch block Left ventricular hypertrophy Inferior infarct, old No significant change since last tracing Confirmed by Calvert Cantor [phone]) on 05/03/2020 12:55:24 PM   Radiology DG Knee 2 Views Right  Result Date: 05/03/2020 CLINICAL DATA:  RIGHT knee pain following fall. EXAM: RIGHT KNEE - 1-2 VIEW COMPARISON:  None. FINDINGS: No acute fracture or dislocation. No joint effusion noted. Chondrocalcinosis identified. Mild tricompartment joint space narrowing noted. No focal bony lesions are present. IMPRESSION: 1. No acute abnormality. 2. Chondrocalcinosis and mild tricompartment degenerative changes. Electronically Signed   By: Margarette Canada M.D.   On: 05/03/2020 14:35   DG Chest Port 1 View  Result Date: 05/03/2020 CLINICAL DATA:  RIGHT hip fracture.  Fall. EXAM: PORTABLE CHEST 1 VIEW COMPARISON:  10/14/2018 and prior radiographs FINDINGS: The cardiomediastinal silhouette is unremarkable. Elevated RIGHT hemidiaphragm again noted. There is no evidence of focal airspace disease, pulmonary edema, suspicious pulmonary nodule/mass, pleural effusion, or pneumothorax. No acute bony abnormalities are identified. Surgical clips in the LEFT axilla are again identified. IMPRESSION: No active disease. Electronically Signed   By: Margarette Canada M.D.   On: 05/03/2020 14:36   DG Hip Unilat With Pelvis 2-3 Views Right  Result Date: 05/03/2020 CLINICAL DATA:   Right hip pain after fall. EXAM: DG HIP (WITH OR WITHOUT PELVIS) 2-3V RIGHT COMPARISON:  None. FINDINGS: There is an acute impacted, subcapital right femoral neck fracture. No dislocation identified. Bilateral hip osteoarthritis. IMPRESSION: Acute, impacted, right subcapital femoral neck fracture. Electronically Signed   By: Kerby Moors M.D.   On: 05/03/2020 11:55    Procedures Procedures  Medications Ordered in the ED Medications  fentaNYL (SUBLIMAZE) injection 50 mcg (50 mcg Intravenous Given 05/03/20 1303)  metoprolol tartrate (LOPRESSOR) tablet 12.5 mg (has no administration in time range)  citalopram (CELEXA) tablet 20 mg (  has no administration in time range)  memantine (NAMENDA) tablet 10 mg (has no administration in time range)  mirtazapine (REMERON) tablet 7.5 mg (has no administration in time range)  rivastigmine (EXELON) capsule 3 mg (has no administration in time range)  polyethylene glycol (MIRALAX / GLYCOLAX) packet 17 g (has no administration in time range)  HYDROcodone-acetaminophen (NORCO/VICODIN) 5-325 MG per tablet 1 tablet (has no administration in time range)  HYDROmorphone (DILAUDID) injection 0.5 mg (has no administration in time range)  enoxaparin (LOVENOX) injection 30 mg (has no administration in time range)     MDM Rules/Calculators/A&P MDM Patient with hip pain, no known falls but was alone for a short period at [facility]. Will start with xray only.  ED Course  I have reviewed the triage vital signs and the nursing notes.  Pertinent labs & imaging results that were available during my care of the patient were reviewed by me and considered in my medical decision making (see chart for details).  Clinical Course as of 05/03/20 1455  Sat May 03, 2020  1218 Hip xray shows acute, impacted, right subcapital femoral neck fracture. Will add Hip fx order set and discuss with Ortho.  [CS]  6333 Spoke with brother, Milta Deiters, at bedside who is her HCPOA. He understands the hip  fracture will likely need surgical repair.  [CS]  66 Spoke with Dr. Rex Kras, Ortho, who will evaluate for surgery, expected to be tomorrow at the earliest. Requesting knee xray as well.  [CS]  5456 CBC unremarkable.  [CS]  2563 INR normal.  [CS]  1327 BMP unremarkable. Will discuss with Hospitalist.  [CS]  [phone] Spoke with Dr. Neysa Bonito, Hospitalist, who will admit for further management. Patient became mild hypoxic after fentanyl injection. Started on supplemental O2.  [CS]    Clinical Course User Index [CS] Truddie Hidden, MD    Final Clinical Impression(s) / ED Diagnoses Final diagnoses:  Closed fracture of neck of right femur, initial encounter [facility])    Rx / DC Orders ED Discharge Orders    None       Truddie Hidden, MD 05/03/20 1455

## 2020-05-03 NOTE — Progress Notes (Signed)
Consult received for right femoral neck fracture. Discussed with Milta Deiters, brother and POA, [phone] R hip hemiarthroplasty. Plan for surgery in the am. NPO after MN. Will give OTO Lovenox now.

## 2020-05-04 ENCOUNTER — Encounter (HOSPITAL_COMMUNITY)
Admission: EM | Disposition: A | Discharge status: Skilled Nursing Facility | Payer: Self-pay | Source: Home / Self Care | Attending: Internal Medicine

## 2020-05-04 ENCOUNTER — Inpatient Hospital Stay (HOSPITAL_COMMUNITY): Payer: Medicare Other | Admitting: Certified Registered Nurse Anesthetist

## 2020-05-04 ENCOUNTER — Inpatient Hospital Stay (HOSPITAL_COMMUNITY): Payer: Medicare Other

## 2020-05-04 ENCOUNTER — Other Ambulatory Visit: Payer: Self-pay

## 2020-05-04 HISTORY — PX: ANTERIOR APPROACH HEMI HIP ARTHROPLASTY: SHX6690

## 2020-05-04 LAB — CBC
HCT: 43.6 % (ref 36.0–46.0)
Hemoglobin: 14.1 g/dL (ref 12.0–15.0)
MCH: 28 pg (ref 26.0–34.0)
MCHC: 32.3 g/dL (ref 30.0–36.0)
MCV: 86.5 fL (ref 80.0–100.0)
Platelets: 158 10*3/uL (ref 150–400)
RBC: 5.04 MIL/uL (ref 3.87–5.11)
RDW: 13.5 % (ref 11.5–15.5)
WBC: 8.9 10*3/uL (ref 4.0–10.5)
nRBC: 0 % (ref 0.0–0.2)

## 2020-05-04 LAB — BASIC METABOLIC PANEL
Anion gap: 9 (ref 5–15)
BUN: 24 mg/dL — ABNORMAL HIGH (ref 8–23)
CO2: 27 mmol/L (ref 22–32)
Calcium: 9.3 mg/dL (ref 8.9–10.3)
Chloride: 105 mmol/L (ref 98–111)
Creatinine, Ser: 0.81 mg/dL (ref 0.44–1.00)
GFR, Estimated: >60 mL/min (ref >60)
Glucose, Bld: 122 mg/dL — ABNORMAL HIGH (ref 70–99)
Potassium: 4.1 mmol/L (ref 3.5–5.1)
Sodium: 141 mmol/L (ref 135–145)

## 2020-05-04 SURGERY — HEMIARTHROPLASTY, HIP, DIRECT ANTERIOR APPROACH, FOR FRACTURE
Anesthesia: Monitor Anesthesia Care | Site: Hip | Laterality: Right

## 2020-05-04 MED ORDER — ENOXAPARIN SODIUM 30 MG/0.3ML ~~LOC~~ SOLN
30.0000 mg | SUBCUTANEOUS | Status: DC
  Administered 2020-05-05 – 2020-05-08 (×4): 30 mg via SUBCUTANEOUS
  Filled 2020-05-04 (×4): qty 0.3

## 2020-05-04 MED ORDER — PROPOFOL 500 MG/50ML IV EMUL
INTRAVENOUS | Status: DC | PRN
  Administered 2020-05-04: 100 ug/kg/min via INTRAVENOUS
  Administered 2020-05-04: 50 ug/kg/min via INTRAVENOUS

## 2020-05-04 MED ORDER — KETOROLAC TROMETHAMINE 30 MG/ML IJ SOLN
INTRAMUSCULAR | Status: AC
  Filled 2020-05-04: qty 1

## 2020-05-04 MED ORDER — ONDANSETRON HCL 4 MG/2ML IJ SOLN: 4.0000 mg | Freq: Once | INTRAMUSCULAR | Status: DC | PRN

## 2020-05-04 MED ORDER — FENTANYL CITRATE (PF) 100 MCG/2ML IJ SOLN: 25.0000 ug | INTRAMUSCULAR | Status: DC | PRN

## 2020-05-04 MED ORDER — ACETAMINOPHEN 10 MG/ML IV SOLN
INTRAVENOUS | Status: DC | PRN
  Administered 2020-05-04: 650 mg via INTRAVENOUS

## 2020-05-04 MED ORDER — PHENYLEPHRINE HCL (PRESSORS) 10 MG/ML IV SOLN
INTRAVENOUS | Status: AC
  Filled 2020-05-04: qty 1

## 2020-05-04 MED ORDER — MENTHOL 3 MG MT LOZG: 1.0000 | LOZENGE | OROMUCOSAL | Status: DC | PRN

## 2020-05-04 MED ORDER — 0.9 % SODIUM CHLORIDE (POUR BTL) OPTIME
TOPICAL | Status: DC | PRN
  Administered 2020-05-04: 1000 mL

## 2020-05-04 MED ORDER — SODIUM CHLORIDE 0.9 % IR SOLN
Status: DC | PRN
  Administered 2020-05-04: 3000 mL
  Administered 2020-05-04: 1000 mL

## 2020-05-04 MED ORDER — ONDANSETRON HCL 4 MG PO TABS: 4.0000 mg | ORAL_TABLET | Freq: Four times a day (QID) | ORAL | Status: DC | PRN

## 2020-05-04 MED ORDER — PROPOFOL 10 MG/ML IV BOLUS
INTRAVENOUS | Status: AC
  Filled 2020-05-04: qty 20

## 2020-05-04 MED ORDER — METOCLOPRAMIDE HCL 5 MG PO TABS: 5.0000 mg | ORAL_TABLET | Freq: Three times a day (TID) | ORAL | Status: DC | PRN

## 2020-05-04 MED ORDER — SODIUM CHLORIDE (PF) 0.9 % IJ SOLN
INTRAMUSCULAR | Status: DC | PRN
  Administered 2020-05-04: 30 mL

## 2020-05-04 MED ORDER — PHENYLEPHRINE HCL-NACL 10-0.9 MG/250ML-% IV SOLN
INTRAVENOUS | Status: DC | PRN
  Administered 2020-05-04: 40 ug/min via INTRAVENOUS

## 2020-05-04 MED ORDER — BUPIVACAINE HCL (PF) 0.5 % IJ SOLN
INTRAMUSCULAR | Status: AC
  Filled 2020-05-04: qty 30

## 2020-05-04 MED ORDER — ONDANSETRON HCL 4 MG/2ML IJ SOLN: 4.0000 mg | Freq: Four times a day (QID) | INTRAMUSCULAR | Status: DC | PRN

## 2020-05-04 MED ORDER — SODIUM CHLORIDE (PF) 0.9 % IJ SOLN
INTRAMUSCULAR | Status: AC
  Filled 2020-05-04: qty 30

## 2020-05-04 MED ORDER — ONDANSETRON HCL 4 MG/2ML IJ SOLN
INTRAMUSCULAR | Status: AC
  Filled 2020-05-04: qty 2

## 2020-05-04 MED ORDER — BUPIVACAINE IN DEXTROSE 0.75-8.25 % IT SOLN
INTRATHECAL | Status: DC | PRN
  Administered 2020-05-04: 2 mL via INTRATHECAL

## 2020-05-04 MED ORDER — ACETAMINOPHEN 10 MG/ML IV SOLN
INTRAVENOUS | Status: AC
  Filled 2020-05-04: qty 100

## 2020-05-04 MED ORDER — PROPOFOL 500 MG/50ML IV EMUL
INTRAVENOUS | Status: AC
  Filled 2020-05-04: qty 50

## 2020-05-04 MED ORDER — ONDANSETRON HCL 4 MG/2ML IJ SOLN
INTRAMUSCULAR | Status: DC | PRN
  Administered 2020-05-04: 4 mg via INTRAVENOUS

## 2020-05-04 MED ORDER — BUPIVACAINE-EPINEPHRINE (PF) 0.5% -1:200000 IJ SOLN
INTRAMUSCULAR | Status: DC | PRN
  Administered 2020-05-04: 30 mL

## 2020-05-04 MED ORDER — ISOPROPYL ALCOHOL 70 % SOLN
Status: AC
  Filled 2020-05-04: qty 480

## 2020-05-04 MED ORDER — DOCUSATE SODIUM 100 MG PO CAPS
100.0000 mg | ORAL_CAPSULE | Freq: Two times a day (BID) | ORAL | Status: DC
  Administered 2020-05-04 – 2020-05-08 (×8): 100 mg via ORAL
  Filled 2020-05-04 (×8): qty 1

## 2020-05-04 MED ORDER — KETOROLAC TROMETHAMINE 30 MG/ML IJ SOLN
INTRAMUSCULAR | Status: DC | PRN
  Administered 2020-05-04: 30 mg

## 2020-05-04 MED ORDER — LACTATED RINGERS IV SOLN: INTRAVENOUS | Status: DC | PRN

## 2020-05-04 MED ORDER — DEXMEDETOMIDINE (PRECEDEX) IN NS 20 MCG/5ML (4 MCG/ML) IV SYRINGE
PREFILLED_SYRINGE | INTRAVENOUS | Status: DC | PRN
  Administered 2020-05-04: 12 ug via INTRAVENOUS

## 2020-05-04 MED ORDER — METHOCARBAMOL 500 MG PO TABS: 500.0000 mg | ORAL_TABLET | Freq: Four times a day (QID) | ORAL | Status: DC | PRN

## 2020-05-04 MED ORDER — PROPOFOL 10 MG/ML IV BOLUS
INTRAVENOUS | Status: DC | PRN
  Administered 2020-05-04 (×2): 20 mg via INTRAVENOUS

## 2020-05-04 MED ORDER — BUPIVACAINE-EPINEPHRINE (PF) 0.5% -1:200000 IJ SOLN
INTRAMUSCULAR | Status: AC
  Filled 2020-05-04: qty 30

## 2020-05-04 MED ORDER — CHLORHEXIDINE GLUCONATE CLOTH 2 % EX PADS
6.0000 | MEDICATED_PAD | Freq: Every day | CUTANEOUS | Status: DC
  Administered 2020-05-04 – 2020-05-05 (×2): 6 via TOPICAL

## 2020-05-04 MED ORDER — METOCLOPRAMIDE HCL 5 MG/ML IJ SOLN
5.0000 mg | Freq: Three times a day (TID) | INTRAMUSCULAR | Status: DC | PRN
Start: 2020-05-04 — End: 2020-05-09

## 2020-05-04 MED ORDER — CEFAZOLIN SODIUM-DEXTROSE 2-4 GM/100ML-% IV SOLN
2.0000 g | Freq: Four times a day (QID) | INTRAVENOUS | Status: AC
  Administered 2020-05-04 (×2): 2 g via INTRAVENOUS
  Filled 2020-05-04 (×2): qty 100

## 2020-05-04 MED ORDER — PHENOL 1.4 % MT LIQD: 1.0000 | OROMUCOSAL | Status: DC | PRN

## 2020-05-04 MED ORDER — METHOCARBAMOL 1000 MG/10ML IJ SOLN
500.0000 mg | Freq: Four times a day (QID) | INTRAVENOUS | Status: DC | PRN
  Filled 2020-05-04: qty 5

## 2020-05-04 SURGICAL SUPPLY — 50 items
AML 19.5 SML 150 LEN 43 ×2 IMPLANT
BLADE CLIPPER SURG (BLADE) IMPLANT
CHLORAPREP W/TINT 26 (MISCELLANEOUS) ×2 IMPLANT
COVER SURGICAL LIGHT HANDLE (MISCELLANEOUS) ×2 IMPLANT
COVER WAND RF STERILE (DRAPES) ×2 IMPLANT
DERMABOND ADVANCED (GAUZE/BANDAGES/DRESSINGS) ×1
DERMABOND ADVANCED .7 DNX12 (GAUZE/BANDAGES/DRESSINGS) ×1 IMPLANT
DRAPE IMP U-DRAPE 54X76 (DRAPES) ×2 IMPLANT
DRAPE SHEET LG 3/4 BI-LAMINATE (DRAPES) ×4 IMPLANT
DRAPE STERI IOBAN 125X83 (DRAPES) ×2 IMPLANT
DRAPE U-SHAPE 47X51 STRL (DRAPES) ×4 IMPLANT
DRSG AQUACEL AG ADV 3.5X10 (GAUZE/BANDAGES/DRESSINGS) ×2 IMPLANT
ELECT REM PT RETURN 15FT ADLT (MISCELLANEOUS) ×2 IMPLANT
EVACUATOR 1/8 PVC DRAIN (DRAIN) IMPLANT
GLOVE SRG 8 PF TXTR STRL LF DI (GLOVE) ×1 IMPLANT
GLOVE SURG ENC MOIS LTX SZ8.5 (GLOVE) ×4 IMPLANT
GLOVE SURG ENC TEXT LTX SZ7.5 (GLOVE) ×4 IMPLANT
GLOVE SURG UNDER POLY LF SZ8 (GLOVE) ×1
GLOVE SURG UNDER POLY LF SZ8.5 (GLOVE) ×2 IMPLANT
GOWN STRL REUS W/ TWL LRG LVL3 (GOWN DISPOSABLE) ×1 IMPLANT
GOWN STRL REUS W/TWL 2XL LVL3 (GOWN DISPOSABLE) ×2 IMPLANT
GOWN STRL REUS W/TWL LRG LVL3 (GOWN DISPOSABLE) ×1
HANDPIECE INTERPULSE COAX TIP (DISPOSABLE) ×1
HEAD FEM UNIPOLAR 44 OD STRL (Hips) ×2 IMPLANT
HIP AML 19.5 SML 150 LEN 43 ×1 IMPLANT
HOOD PEEL AWAY FLYTE STAYCOOL (MISCELLANEOUS) ×6 IMPLANT
IRRIGATION SURGIPHOR STRL (IV SOLUTION) IMPLANT
KIT TURNOVER KIT A (KITS) ×2 IMPLANT
MANIFOLD NEPTUNE II (INSTRUMENTS) ×2 IMPLANT
MARKER SKIN DUAL TIP RULER LAB (MISCELLANEOUS) ×2 IMPLANT
NEEDLE SPNL 18GX3.5 QUINCKE PK (NEEDLE) ×2 IMPLANT
NS IRRIG 1000ML POUR BTL (IV SOLUTION) ×2 IMPLANT
PACK ANTERIOR HIP CUSTOM (KITS) ×2 IMPLANT
PENCIL SMOKE EVACUATOR (MISCELLANEOUS) IMPLANT
SAW OSC TIP CART 19.5X105X1.3 (SAW) ×2 IMPLANT
SEALER BIPOLAR AQUA 6.0 (INSTRUMENTS) ×2 IMPLANT
SET HNDPC FAN SPRY TIP SCT (DISPOSABLE) ×1 IMPLANT
SPACER DEPUY (Hips) ×2 IMPLANT
SUT ETHIBOND NAB CT1 #1 30IN (SUTURE) ×4 IMPLANT
SUT MNCRL AB 3-0 PS2 18 (SUTURE) ×2 IMPLANT
SUT MON AB 2-0 CT1 36 (SUTURE) ×2 IMPLANT
SUT STRATAFIX PDO 1 14 VIOLET (SUTURE) ×1
SUT STRATFX PDO 1 14 VIOLET (SUTURE) ×1
SUT VIC AB 1 CT1 27 (SUTURE) ×1
SUT VIC AB 1 CT1 27XBRD ANTBC (SUTURE) ×1 IMPLANT
SUT VIC AB 2-0 CT1 27 (SUTURE) ×1
SUT VIC AB 2-0 CT1 TAPERPNT 27 (SUTURE) ×1 IMPLANT
SUTURE STRATFX PDO 1 14 VIOLET (SUTURE) ×1 IMPLANT
TRAY FOLEY MTR SLVR 16FR STAT (SET/KITS/TRAYS/PACK) IMPLANT
WATER STERILE IRR 1000ML POUR (IV SOLUTION) ×4 IMPLANT

## 2020-05-04 NOTE — Plan of Care (Signed)
  Problem: Education: Goal: Knowledge of General Education information will improve Description: Including pain rating scale, medication(s)/side effects and non-pharmacologic comfort measures Outcome: Progressing   Problem: Health Behavior/Discharge Planning: Goal: Ability to manage health-related needs will improve Outcome: Progressing   Problem: Clinical Measurements: Goal: Ability to maintain clinical measurements within normal limits will improve Outcome: Progressing Goal: Will remain free from infection Outcome: Progressing Goal: Diagnostic test results will improve Outcome: Progressing Goal: Respiratory complications will improve Outcome: Progressing Goal: Cardiovascular complication will be avoided Outcome: Progressing   Problem: Activity: Goal: Risk for activity intolerance will decrease Outcome: Progressing   Problem: Nutrition: Goal: Adequate nutrition will be maintained Outcome: Progressing   Problem: Coping: Goal: Level of anxiety will decrease Outcome: Progressing   Problem: Elimination: Goal: Will not experience complications related to bowel motility Outcome: Progressing Goal: Will not experience complications related to urinary retention Outcome: Progressing   Problem: Pain Managment: Goal: General experience of comfort will improve Outcome: Progressing   Problem: Safety: Goal: Ability to remain free from injury will improve Outcome: Progressing   Problem: Skin Integrity: Goal: Risk for impaired skin integrity will decrease Outcome: Progressing   Problem: Education: Goal: Verbalization of understanding the information provided (i.e., activity precautions, restrictions, etc) will improve Outcome: Progressing   Problem: Activity: Goal: Ability to ambulate and perform ADLs will improve Outcome: Progressing   Problem: Clinical Measurements: Goal: Postoperative complications will be avoided or minimized Outcome: Progressing   Problem: Pain  Management: Goal: Pain level will decrease Outcome: Progressing

## 2020-05-04 NOTE — Op Note (Signed)
OPERATIVE REPORT  SURGEON: Rod Can, MD   ASSISTANT: Merla Riches, PA-C  PREOPERATIVE DIAGNOSIS: Displaced Right femoral neck fracture.   POSTOPERATIVE DIAGNOSIS: Displaced Right femoral neck fracture.   PROCEDURE: Right hip hemiarthroplasty, anterior approach.   IMPLANTS: DePuy AML stem, size 18.0 mm, small stature, with a -3 mm spacer and a 44 mm monopolar head ball.  ANESTHESIA:  MAC and Spinal  ANTIBIOTICS: 2g ancef.  ESTIMATED BLOOD LOSS:-100 mL    DRAINS: None.  COMPLICATIONS: None   CONDITION: PACU - hemodynamically stable.   BRIEF CLINICAL NOTE: Amanda Randall is a 87 y.o. female with a displaced Right femoral neck fracture. The patient was admitted to the hospitalist service and underwent perioperative risk stratification and medical optimization. The risks, benefits, and alternatives to hemiarthroplasty were explained, and the patient elected to proceed.  PROCEDURE IN DETAIL: The patient was taken to the operating room and general anesthesia was induced on the hospital bed.  The patient was then positioned on the Hana table.  All bony prominences were well padded.  The hip was prepped and draped in the normal sterile surgical fashion.  A time-out was called verifying side and site of surgery. Antibiotics were given within 60 minutes of beginning the procedure.   The direct anterior approach to the hip was performed through the Hueter interval.  Lateral femoral circumflex vessels were treated with the Auqumantys. The anterior capsule was exposed and an inverted T capsulotomy was made.  Fracture hematoma was encountered and evacuated. The patient was found to have a comminuted Right subcapital femoral neck fracture. There was a v shaped defect in the calcar with anterior femoral neck compromise.  I freshened the femoral neck cut with a saw.  I removed the femoral neck fragment.  A corkscrew was placed into the head and the head was removed.  This was passed to the back  table and was measured.   Acetabular exposure was achieved.  I examined the articular cartilage which was intact.  The labrum was intact. A 44 mm trial head was placed and found to have excellent fit.   I then gained femoral exposure taking care to protect the abductors and greater trochanter.  This was performed using standard external rotation, extension, and adduction.  The capsule was peeled off the inner aspect of the greater trochanter, taking care to preserve the short external rotators. A cookie cutter was used to enter the femoral canal, and then the femoral canal finder was used to confirm location.  I then sequentially reamed and broached up to a size 18 mm small stature.  Calcar planer was used on the femoral neck remnant.  I paced a trial neck and a 36 + 1.5 head ball. The hip was reduced.  Leg lengths were checked fluoroscopically.  The hip was dislocated and trial components were removed.  I placed the real stem followed by the real spacer and head ball.  A single reduction maneuver was performed and the hip was reduced.  Fluoroscopy was used to confirm component position and leg lengths.  At 90 degrees of external rotation and extension, the hip was stable to an anterior directed force.   The wound was copiously irrigated with Irrisept solution and normal saline using pule lavage.  Marcaine solution was injected into the periarticular soft tissue.  The wound was closed in layers using #1 Vicryl and V-Loc for the fascia, 2-0 Vicryl for the subcutaneous fat, 2-0 Monocryl for the deep dermal layer, and staples + glue  for the skin.  Once the glue was fully dried, an Aquacell Ag dressing was applied.  The patient was then awakened from anesthesia and transported to the recovery room in stable condition.  Sponge, needle, and instrument counts were correct at the end of the case x2.  The patient tolerated the procedure well and there were no known complications.  Please note that a surgical  assistant was a medical necessity for this procedure to perform it in a safe and expeditious manner. Assistant was necessary to provide appropriate retraction of vital neurovascular structures, to prevent femoral fracture, and to allow for anatomic placement of the prosthesis.

## 2020-05-04 NOTE — Anesthesia Procedure Notes (Signed)
Spinal  Patient location during procedure: OR Start time: 05/04/2020 8:13 AM End time: 05/04/2020 8:26 AM Reason for block: surgical anesthesia Staffing Performed: anesthesiologist  Anesthesiologist: Pervis Hocking, DO Preanesthetic Checklist Completed: patient identified, IV checked, risks and benefits discussed, surgical consent, monitors and equipment checked, pre-op evaluation and timeout performed Spinal Block Patient position: right lateral decubitus Prep: DuraPrep and site prepped and draped Patient monitoring: cardiac monitor, continuous pulse ox and blood pressure Approach: midline Location: L3-4 Injection technique: single-shot Needle Needle type: Pencan  Needle gauge: 24 G Needle length: 9 cm Assessment Sensory level: T6 Events: CSF return Additional Notes Functioning IV was confirmed and monitors were applied. Sterile prep and drape, including hand hygiene and sterile gloves were used. The patient was positioned and the spine was prepped. The skin was anesthetized with lidocaine.  Free flow of clear CSF was obtained prior to injecting local anesthetic into the CSF.  The spinal needle aspirated freely following injection.  The needle was carefully withdrawn.  The patient tolerated the procedure well.   One prior attempt by crna

## 2020-05-04 NOTE — Anesthesia Postprocedure Evaluation (Signed)
Anesthesia Post Note  Patient: BALEIGH RENNAKER  Procedure(s) Performed: ANTERIOR APPROACH HEMI HIP ARTHROPLASTY (Right Hip)     Patient location during evaluation: PACU Anesthesia Type: MAC and Spinal Level of consciousness: awake and alert Pain management: pain level controlled Vital Signs Assessment: post-procedure vital signs reviewed and stable Respiratory status: spontaneous breathing, nonlabored ventilation and respiratory function stable Cardiovascular status: blood pressure returned to baseline and stable Postop Assessment: no apparent nausea or vomiting, no backache, no headache and spinal receding Anesthetic complications: no   No complications documented.  Last Vitals:  Vitals:   05/04/20 1013 05/04/20 1015  BP: 93/60 (!) 106/59  Pulse:  (!) 56  Resp: 12 12  Temp: (!) 36.4 C   SpO2: 96% 96%    Last Pain:  Vitals:   05/04/20 0513  TempSrc: Oral  PainSc:                  Pervis Hocking

## 2020-05-04 NOTE — Progress Notes (Signed)
PROGRESS NOTE  Amanda Randall  DOB: 15-Nov-1927  PCP: Thurman Coyer, MD TKW:[id number]  DOA: 05/03/2020  LOS: 1 day   Chief Complaint  Patient presents with  . Hip Pain    Brief narrative: Amanda Randall is a 87 y.o. female with PMH significant for advanced dementia, hypertension from [facility] who presented to the ED with complaints of right hip pain.   Patient was in her normal state of health until around lunchtime on the day of admission when she started complaining of right hip pain.  She has lately been not using her walker.  No known falls.  In the ED, patient was afebrile, hemodynamically stable, hypoxic to 84%. X-ray showed acute, impacted, right subcapital femoral neck fracture. Orthopedics Dr. Lyla Glassing was consulted. Patient was admitted to hospitalist service.  Subjective: Patient was seen and examined this afternoon.  Pleasant elderly female.  Somnolent under the effect of pain medicine post procedure.  Tries to open her eyes on touch. Chart reviewed. Hemodynamically stable with blood pressure in 140s Labs unremarkable  Assessment/Plan: Acute impacted right subcapital femoral neck fracture -Probably osteoporotic in nature.  No known history of fall. -Patient underwent right hip uniarthroplasty by orthopedic surgery Dr. Lyla Glassing today.   -DVT prophylaxis and pain management per orthopedics post procedure -Keep on gentle hydration with normal saline at 75 mill per hour for next 24 hours.  Acute respiratory failure with hypoxia -Brief episode in the ED probably after dose of fentanyl.  Currently back on room air  Advanced dementia -Continue Namenda, Celexa, Remeron, rivastigmine.  Valium on hold. -High risk of delirium.  Fall precautions  Essential hypertension -Continue to monitor blood pressure on Lopressor  Mobility: PT eval Code Status:   Code Status: Full Code  Nutritional status: Body mass index is 17.38 kg/m.     Diet Order            Diet  regular Room service appropriate? Yes; Fluid consistency: Thin  Diet effective now                 DVT prophylaxis: enoxaparin (LOVENOX) injection 30 mg Start: 05/05/20 0800 SCDs Start: 05/04/20 1320   Antimicrobials:  None Fluid: Normal saline at 75 mill per hour Consultants: Orthopedics Family Communication:  None at bedside  Status is: Inpatient   Remains inpatient appropriate because: POD 0   Dispo: The patient is from: Home              Anticipated d/c is to: Home versus rehab, pending PT eval              Patient currently is not medically stable to d/c.   Difficult to place patient No       Infusions:  .  ceFAZolin (ANCEF) IV 2 g (05/04/20 1521)  . methocarbamol (ROBAXIN) IV      Scheduled Meds: . Chlorhexidine Gluconate Cloth  6 each Topical Daily  . citalopram  20 mg Oral Daily  . docusate sodium  100 mg Oral BID  . [START ON 05/05/2020] enoxaparin (LOVENOX) injection  30 mg Subcutaneous Q24H  . memantine  10 mg Oral BID  . metoprolol tartrate  12.5 mg Oral BID  . mirtazapine  7.5 mg Oral QHS  . polyethylene glycol  17 g Oral Daily  . rivastigmine  3 mg Oral BID    Antimicrobials: Anti-infectives (From admission, onward)   Start     Dose/Rate Route Frequency Ordered Stop   05/04/20 1400  ceFAZolin (  ANCEF) IVPB 2g/100 mL premix        2 g 200 mL/hr over 30 Minutes Intravenous Every 6 hours 05/04/20 1319 05/05/20 0159   05/04/20 0600  ceFAZolin (ANCEF) IVPB 2g/100 mL premix        2 g 200 mL/hr over 30 Minutes Intravenous On call to O.R. 05/03/20 1504 05/04/20 0858   05/04/20 0600  ceFAZolin (ANCEF) 3 g in dextrose 5 % 50 mL IVPB  Status:  Discontinued        3 g 100 mL/hr over 30 Minutes Intravenous On call to O.R. 05/03/20 1504 05/03/20 1510      PRN meds: HYDROcodone-acetaminophen, HYDROmorphone (DILAUDID) injection, menthol-cetylpyridinium **OR** phenol, methocarbamol **OR** methocarbamol (ROBAXIN) IV, metoCLOPramide **OR** metoCLOPramide  (REGLAN) injection, ondansetron **OR** ondansetron (ZOFRAN) IV   Objective: Vitals:   05/04/20 1153 05/04/20 1611  BP: (!) 138/92 (!) 150/70  Pulse: 61 76  Resp: 16 16  Temp: 97.7 F (36.5 C) 98.8 F (37.1 C)  SpO2: 96% 91%    Intake/Output Summary (Last 24 hours) at 05/04/2020 1636 Last data filed at 05/04/2020 1041 Gross per 24 hour  Intake 1795 ml  Output 130 ml  Net 1665 ml   Filed Weights   05/03/20 1458  Weight: 41.7 kg   Weight change:  Body mass index is 17.38 kg/m.   Physical Exam: General exam: elderly Caucasian female with dementia at baseline.  Not in pain.  Somnolent from the effect of pain medicines Skin: No rashes, lesions or ulcers. HEENT: Atraumatic, normocephalic, no obvious bleeding Lungs: Clear to auscultation bilaterally CVS: Regular rate and rhythm, no murmur GI/Abd soft, nontender, nondistended, bowel sound present CNS: Somnolent, tries to open eyes on touch Psychiatry: Unable to examine because of sedation Extremities: No pedal edema, no calf tenderness  Data Review: I have personally reviewed the laboratory data and studies available.  Recent Labs  Lab 05/03/20 1255 05/04/20 0500  WBC 11.7* 8.9  NEUTROABS 10.6*  --   HGB 15.2* 14.1  HCT 47.4* 43.6  MCV 87.6 86.5  PLT 187 158   Recent Labs  Lab 05/03/20 1255 05/04/20 0500  NA 139 141  K 4.2 4.1  CL 102 105  CO2 27 27  GLUCOSE 111* 122*  BUN 22 24*  CREATININE 0.85 0.81  CALCIUM 9.3 9.3    F/u labs ordered Unresulted Labs (From admission, onward)          Start     Ordered   05/11/20 0500  Creatinine, serum  (enoxaparin (LOVENOX)  CrCl < 30 mL/min)  Weekly,   R     Comments: while on enoxaparin therapy.    05/04/20 1319   05/05/20 0500  CBC  Daily,   R      05/04/20 1319   05/05/20 2952  Basic metabolic panel  Daily,   R      05/04/20 1319          Signed, Terrilee Croak, MD [facility] 05/04/2020

## 2020-05-04 NOTE — Transfer of Care (Signed)
Immediate Anesthesia Transfer of Care Note  Patient: Amanda Randall  Procedure(s) Performed: ANTERIOR APPROACH HEMI HIP ARTHROPLASTY (Right Hip)  Patient Location: PACU  Anesthesia Type:Spinal  Level of Consciousness: drowsy  Airway & Oxygen Therapy: Patient Spontanous Breathing and Patient connected to face mask oxygen  Post-op Assessment: Report given to RN and Post -op Vital signs reviewed and stable  Post vital signs: Reviewed and stable  Last Vitals:  Vitals Value Taken Time  BP 106/59 05/04/20 1015  Temp 36.4 C 05/04/20 1013  Pulse 57 05/04/20 1017  Resp 13 05/04/20 1017  SpO2 97 % 05/04/20 1017  Vitals shown include unvalidated device data.  Last Pain:  Vitals:   05/04/20 0513  TempSrc: Oral  PainSc:          Complications: No complications documented.

## 2020-05-04 NOTE — Consult Note (Signed)
ORTHOPAEDIC CONSULTATION  REQUESTING PHYSICIAN: Terrilee Croak, MD  PCP:  Thurman Coyer, MD  Chief Complaint: Right hip injury  HPI: Amanda Randall is a 87 y.o. female  with past medical history of advanced dementia, hypertension from [facility] who presented to the ED with complaints of right hip pain.  Patient was in her normal state of health as of yesterday morning morning until around lunchtime when she started complaining of right hip pain.  Brother says that she is supposed to be using a walker but she has not been lately.  Apparently she was able to walk back to her room.  No known falls.  She was brought to the emergency department at [facility], where x-rays revealed a displaced right femoral neck fracture.  Chart review and discussion with brother were used to obtain history due to advanced dementia.  She denies other injuries.   Past Medical History:  Diagnosis Date  . Dementia ([place])   . Hypertension    No past surgical history on file. Social History   Socioeconomic History  . Marital status: Unknown    Spouse name: Not on file  . Number of children: Not on file  . Years of education: Not on file  . Highest education level: Not on file  Occupational History  . Not on file  Tobacco Use  . Smoking status: Never Smoker  . Smokeless tobacco: Never Used  Vaping Use  . Vaping Use: Never used  Substance and Sexual Activity  . Alcohol use: No  . Drug use: No  . Sexual activity: Not on file  Other Topics Concern  . Not on file  Social History Narrative  . Not on file   Social Determinants of Health   Financial Resource Strain: Not on file  Food Insecurity: Not on file  Transportation Needs: Not on file  Physical Activity: Not on file  Stress: Not on file  Social Connections: Not on file   No family history on file. No Known Allergies Prior to Admission medications   Medication Sig Start Date End Date Taking? Authorizing Provider   acetaminophen (TYLENOL) 325 MG tablet Take 650 mg by mouth every 6 (six) hours as needed for mild pain.   Yes [provider]  citalopram (CELEXA) 20 MG tablet Take 20 mg by mouth daily.   Yes [provider]  ENSURE (ENSURE) Take 1 Can by mouth 2 (two) times daily between meals. VANILLA   Yes [provider]  memantine (NAMENDA) 10 MG tablet Take 10 mg by mouth 2 (two) times daily.   Yes [provider]  metoprolol tartrate (LOPRESSOR) 25 MG tablet Take 12.5 mg by mouth 2 (two) times daily. HOLD FOR SYSTOLIC BLOOD PRESSURE LESS THAN OR EQUAL TO 110   Yes [provider]  mirtazapine (REMERON) 15 MG tablet Take 15 mg by mouth at bedtime. 04/09/20  Yes [provider]  Multiple Vitamin (MULTIVITAMIN WITH MINERALS) TABS tablet Take 1 tablet by mouth daily.   Yes [provider]  polyethylene glycol (MIRALAX / GLYCOLAX) packet Take 17 g by mouth daily.   Yes [provider]  rivastigmine (EXELON) 3 MG capsule Take 3 mg by mouth 2 (two) times daily.   Yes [provider]  vitamin B-12 (CYANOCOBALAMIN) 250 MCG tablet Take 250 mcg by mouth daily.   Yes [provider]   DG Knee 2 Views Right  Result Date: 05/03/2020 CLINICAL DATA:  RIGHT knee pain following  fall. EXAM: RIGHT KNEE - 1-2 VIEW COMPARISON:  None. FINDINGS: No acute fracture or dislocation. No joint effusion noted. Chondrocalcinosis identified. Mild tricompartment joint space narrowing noted. No focal bony lesions are present. IMPRESSION: 1. No acute abnormality. 2. Chondrocalcinosis and mild tricompartment degenerative changes. Electronically Signed   By: Margarette Canada M.D.   On: 05/03/2020 14:35   DG Chest Port 1 View  Result Date: 05/03/2020 CLINICAL DATA:  RIGHT hip fracture.  Fall. EXAM: PORTABLE CHEST 1 VIEW COMPARISON:  10/14/2018 and prior radiographs FINDINGS: The cardiomediastinal silhouette is unremarkable. Elevated RIGHT hemidiaphragm again  noted. There is no evidence of focal airspace disease, pulmonary edema, suspicious pulmonary nodule/mass, pleural effusion, or pneumothorax. No acute bony abnormalities are identified. Surgical clips in the LEFT axilla are again identified. IMPRESSION: No active disease. Electronically Signed   By: Margarette Canada M.D.   On: 05/03/2020 14:36   DG Hip Unilat With Pelvis 2-3 Views Right  Result Date: 05/03/2020 CLINICAL DATA:  Right hip pain after fall. EXAM: DG HIP (WITH OR WITHOUT PELVIS) 2-3V RIGHT COMPARISON:  None. FINDINGS: There is an acute impacted, subcapital right femoral neck fracture. No dislocation identified. Bilateral hip osteoarthritis. IMPRESSION: Acute, impacted, right subcapital femoral neck fracture. Electronically Signed   By: Kerby Moors M.D.   On: 05/03/2020 11:55    Positive ROS: All other systems have been reviewed and were otherwise negative with the exception of those mentioned in the HPI and as above.  Physical Exam: General: Alert, no acute distress Cardiovascular: No pedal edema Respiratory: No cyanosis, no use of accessory musculature GI: No organomegaly, abdomen is soft and non-tender Skin: No lesions in the area of chief complaint Neurologic: Sensation intact distally Psychiatric: Pleasantly confused Lymphatic: No axillary or cervical lymphadenopathy  MUSCULOSKELETAL: Examination the right hip reveals no skin wounds or lesions.  She is shortened and externally rotated.  She has pain with attempted logrolling of the hip.  Her foot is warm and well-perfused.  She does not cooperate with motor or sensory examination.  Assessment: Displaced right femoral neck fracture. Dementia.  Plan: I discussed the findings with Milta Deiters, the patient's brother and [place], [phone].  She has a displaced right femoral neck fracture that will require surgical management.  Recommend R hip hemiarthroplasty for pain control and to allow mobilization out of bed.  We discussed the risk,  benefits, and alternatives.  Please see statement of risk.  Plan for surgery today.  The risks, benefits, and alternatives were discussed with the patient. There are risks associated with the surgery including, but not limited to, problems with anesthesia (death), infection, instability (giving out of the joint), dislocation, differences in leg length/angulation/rotation, fracture of bones, loosening or failure of implants, hematoma (blood accumulation) which may require surgical drainage, blood clots, pulmonary embolism, nerve injury (foot drop and lateral thigh numbness), and blood vessel injury. The patient understands these risks and elects to proceed.    Bertram Savin, MD [phone]    05/04/2020 8:00 AM

## 2020-05-04 NOTE — Anesthesia Preprocedure Evaluation (Signed)
Anesthesia Evaluation  Patient identified by MRN, date of birth, ID band Patient awake    Reviewed: Allergy & Precautions, NPO status , Patient's Chart, lab work & pertinent test results, reviewed documented beta blocker date and time   Airway Mallampati: II  TM Distance: >3 FB Neck ROM: Full    Dental  (+) Dental Advisory Given, Edentulous Upper   Pulmonary neg pulmonary ROS,    Pulmonary exam normal breath sounds clear to auscultation       Cardiovascular hypertension, Pt. on medications and Pt. on home beta blockers Normal cardiovascular exam Rhythm:Regular Rate:Normal     Neuro/Psych PSYCHIATRIC DISORDERS Dementia vertigo    GI/Hepatic negative GI ROS, Neg liver ROS,   Endo/Other  negative endocrine ROS  Renal/GU negative Renal ROS  negative genitourinary   Musculoskeletal Right femoral neck fx   Abdominal   Peds negative pediatric ROS (+)  Hematology negative hematology ROS (+) hct 43.6, plt 158   Anesthesia Other Findings Uses walker at baseline  prophylactic lovenox dose >12h ago  Reproductive/Obstetrics negative OB ROS                            Anesthesia Physical Anesthesia Plan  ASA: III  Anesthesia Plan: MAC and Spinal   Post-op Pain Management:    Induction:   PONV Risk Score and Plan: 2 and Propofol infusion and TIVA  Airway Management Planned: Natural Airway and Simple Face Mask  Additional Equipment: None  Intra-op Plan:   Post-operative Plan:   Informed Consent: I have reviewed the patients History and Physical, chart, labs and discussed the procedure including the risks, benefits and alternatives for the proposed anesthesia with the patient or authorized representative who has indicated his/her understanding and acceptance.       Plan Discussed with: CRNA  Anesthesia Plan Comments:         Anesthesia Quick Evaluation

## 2020-05-05 ENCOUNTER — Encounter (HOSPITAL_COMMUNITY): Payer: Self-pay | Admitting: Internal Medicine

## 2020-05-05 DIAGNOSIS — E43 Unspecified severe protein-calorie malnutrition: Secondary | ICD-10-CM | POA: Insufficient documentation

## 2020-05-05 LAB — CBC
HCT: 38.1 % (ref 36.0–46.0)
Hemoglobin: 12 g/dL (ref 12.0–15.0)
MCH: 28 pg (ref 26.0–34.0)
MCHC: 31.5 g/dL (ref 30.0–36.0)
MCV: 89 fL (ref 80.0–100.0)
Platelets: 120 10*3/uL — ABNORMAL LOW (ref 150–400)
RBC: 4.28 MIL/uL (ref 3.87–5.11)
RDW: 13.5 % (ref 11.5–15.5)
WBC: 6.6 10*3/uL (ref 4.0–10.5)
nRBC: 0 % (ref 0.0–0.2)

## 2020-05-05 LAB — BASIC METABOLIC PANEL
Anion gap: 6 (ref 5–15)
BUN: 25 mg/dL — ABNORMAL HIGH (ref 8–23)
CO2: 27 mmol/L (ref 22–32)
Calcium: 8.3 mg/dL — ABNORMAL LOW (ref 8.9–10.3)
Chloride: 106 mmol/L (ref 98–111)
Creatinine, Ser: 1.01 mg/dL — ABNORMAL HIGH (ref 0.44–1.00)
GFR, Estimated: 53 mL/min — ABNORMAL LOW (ref >60)
Glucose, Bld: 106 mg/dL — ABNORMAL HIGH (ref 70–99)
Potassium: 4 mmol/L (ref 3.5–5.1)
Sodium: 139 mmol/L (ref 135–145)

## 2020-05-05 MED ORDER — ADULT MULTIVITAMIN W/MINERALS CH
1.0000 | ORAL_TABLET | Freq: Every day | ORAL | Status: DC
  Administered 2020-05-05 – 2020-05-08 (×3): 1 via ORAL
  Filled 2020-05-05 (×4): qty 1

## 2020-05-05 MED ORDER — HYDROCODONE-ACETAMINOPHEN 5-325 MG PO TABS
1.0000 | ORAL_TABLET | Freq: Four times a day (QID) | ORAL | 0 refills | Status: AC | PRN
Start: 2020-05-05 — End: 2020-05-12

## 2020-05-05 MED ORDER — ENSURE ENLIVE PO LIQD
237.0000 mL | Freq: Two times a day (BID) | ORAL | Status: DC
  Administered 2020-05-05 – 2020-05-08 (×7): 237 mL via ORAL

## 2020-05-05 MED ORDER — ASPIRIN 81 MG PO CHEW: 81.0000 mg | CHEWABLE_TABLET | Freq: Two times a day (BID) | ORAL | 0 refills | Status: AC

## 2020-05-05 MED ORDER — SODIUM CHLORIDE 0.9 % IV SOLN: INTRAVENOUS | Status: DC

## 2020-05-05 NOTE — Evaluation (Signed)
Occupational Therapy Evaluation Patient Details Name: Amanda Randall MRN: [id number] DOB: 06/03/1927 Today's Date: 05/05/2020    History of Present Illness Pt is a 87 y.o. female from [place] admitted 05/03/20 for Right hip hemiarthroplasty, direct anterior approach after a fall.  PMH includes: advanced dementia, hypertension.   Clinical Impression   Pt from [facility] and unreliable historian during session, per chart review she typically should use a RW for mobility (but doesn't and so she fell) Today she is max to total A for LB ADL, mod to set up for UB ADL. She is min A for transfers with RW (cues for sequencing and safety) and mod A for bed mobility. Pt is oriented to self-only throughout session, but is the sweetest and most pleasant person. Left in chair with alarm on, mitts on, SCD's on, and hymns playing. OT will continue to follow acutely with Pt requiring SNF post-acute to return  To PLOF.     Follow Up Recommendations  SNF    Equipment Recommendations  Other (comment) (defer to next venue of care)    Recommendations for Other Services       Precautions / Restrictions Precautions Precautions: Fall Precaution Comments: dementia Restrictions Weight Bearing Restrictions: Yes RLE Weight Bearing: Weight bearing as tolerated      Mobility Bed Mobility Overal bed mobility: Needs Assistance Bed Mobility: Supine to Sit     Supine to sit: Mod assist;HOB elevated;+2 for safety/equipment     General bed mobility comments: cues for sequencing, use of bed pad to progress hips to EOB, assist for trunk elevation    Transfers Overall transfer level: Needs assistance Equipment used: Rolling walker (2 wheeled) Transfers: Sit to/from Stand Sit to Stand: Min assist;+2 safety/equipment;From elevated surface         General transfer comment: from bed x2, assist with hand placement on RW, slight balance/boost on R side    Balance Overall balance  assessment: Needs assistance Sitting-balance support: Single extremity supported;Feet supported Sitting balance-Leahy Scale: Fair     Standing balance support: Bilateral upper extremity supported Standing balance-Leahy Scale: Poor Standing balance comment: benefits from BUE support on RW                           ADL either performed or assessed with clinical judgement   ADL Overall ADL's : Needs assistance/impaired Eating/Feeding: Maximal assistance;Sitting Eating/Feeding Details (indicate cue type and reason): currently in mitts Grooming: Moderate assistance;Sitting;Wash/dry face Grooming Details (indicate cue type and reason): in recliner Upper Body Bathing: Maximal assistance   Lower Body Bathing: Total assistance   Upper Body Dressing : Maximal assistance   Lower Body Dressing: Total assistance   Toilet Transfer: Minimal assistance;+2 for safety/equipment;Ambulation;RW Toilet Transfer Details (indicate cue type and reason): increased time Toileting- Clothing Manipulation and Hygiene: Total assistance Toileting - Clothing Manipulation Details (indicate cue type and reason): currently with foley     Functional mobility during ADLs: Min guard;Minimal assistance;Rolling walker General ADL Comments: decreased access to LB post-op, transfers at min A for balance     Vision         Perception     Praxis      Pertinent Vitals/Pain Pain Assessment: Faces Faces Pain Scale: Hurts little more Pain Location: R hip down leg Pain Descriptors / Indicators: Discomfort;Sore Pain Intervention(s): Monitored during session;Repositioned     Hand Dominance     Extremity/Trunk Assessment Upper Extremity Assessment Upper Extremity Assessment: Overall  WFL for tasks assessed   Lower Extremity Assessment Lower Extremity Assessment: RLE deficits/detail RLE Deficits / Details: post-op defciits as anticipated RLE Coordination: decreased gross motor   Cervical / Trunk  Assessment Cervical / Trunk Assessment: Kyphotic   Communication Communication Communication: No difficulties (very mumbly (no dentures) hard to understand)   Cognition Arousal/Alertness: Awake/alert Behavior During Therapy: WFL for tasks assessed/performed Overall Cognitive Status: History of cognitive impairments - at baseline                                 General Comments: baseline advanced dementia, oriented to self only this session - but very very sweet and pleasant   General Comments  Pt very pleasant and starting singing hymns at the end of session - left hymn playlist on in room    Exercises     Shoulder Instructions      Home Living Family/patient expects to be discharged to:: Skilled nursing facility                                 Additional Comments: from [name]      Prior Functioning/Environment Level of Independence: Needs assistance  Gait / Transfers Assistance Needed: uses RW at baseline (but has not been using it recently per brother) ADL's / Homemaking Assistance Needed: assist from staff   Comments: Pt unreliable historian, PLOF gleaned from chart review        OT Problem List: Decreased range of motion;Decreased activity tolerance;Impaired balance (sitting and/or standing);Decreased safety awareness;Decreased knowledge of use of DME or AE      OT Treatment/Interventions: Self-care/ADL training;DME and/or AE instruction;Therapeutic activities;Patient/family education;Balance training    OT Goals(Current goals can be found in the care plan section) Acute Rehab OT Goals Patient Stated Goal: "to go for a ride" OT Goal Formulation: With patient Time For Goal Achievement: 05/19/20 Potential to Achieve Goals: Good ADL Goals Pt Will Perform Grooming: with set-up;sitting Pt Will Perform Upper Body Dressing: with set-up;sitting Pt Will Perform Lower Body Dressing: with min assist;sit to/from stand Pt Will Transfer to  Toilet: with supervision;ambulating Pt Will Perform Toileting - Clothing Manipulation and hygiene: with min guard assist;sit to/from stand  OT Frequency: Min 2X/week   Barriers to D/C:            Co-evaluation PT/OT/SLP Co-Evaluation/Treatment: Yes Reason for Co-Treatment: Necessary to address cognition/behavior during functional activity;For patient/therapist safety;To address functional/ADL transfers PT goals addressed during session: Mobility/safety with mobility;Balance;Strengthening/ROM;Proper use of DME OT goals addressed during session: ADL's and self-care;Proper use of Adaptive equipment and DME      AM-PAC OT "6 Clicks" Daily Activity     Outcome Measure Help from another person eating meals?: Total (mitts) Help from another person taking care of personal grooming?: A Little Help from another person toileting, which includes using toliet, bedpan, or urinal?: A Lot Help from another person bathing (including washing, rinsing, drying)?: A Lot Help from another person to put on and taking off regular upper body clothing?: A Lot Help from another person to put on and taking off regular lower body clothing?: Total 6 Click Score: 11   End of Session Equipment Utilized During Treatment: Gait belt;Rolling walker Nurse Communication: Mobility status;Precautions;Weight bearing status (asked about breakfast for Pt)  Activity Tolerance: Patient tolerated treatment well Patient left: in chair;with call bell/phone within reach;with chair alarm set;with restraints  reapplied;with SCD's reapplied  OT Visit Diagnosis: Unsteadiness on feet (R26.81);Other abnormalities of gait and mobility (R26.89);History of falling (Z91.81);Pain Pain - Right/Left: Right Pain - part of body: Hip                Time: 0935-1003 OT Time Calculation (min): 28 min Charges:  OT General Charges $OT Visit: 1 Visit OT Evaluation $OT Eval Moderate Complexity: [place] OTR/L Acute Rehabilitation  Services Pager: [phone] Office: [place] 05/05/2020, 10:21 AM

## 2020-05-05 NOTE — Progress Notes (Signed)
Initial Nutrition Assessment  DOCUMENTATION CODES:   Severe malnutrition in context of chronic illness,Underweight  INTERVENTION:  - will order Ensure Enlive BID, each supplement provides 350 kcal and 20 grams of protein. - will order Magic Cup with dinner meals, each supplement provides 290 kcal and 9 grams of protein. - will order 1 tablet multivitamin with minerals/day.   NUTRITION DIAGNOSIS:   Severe Malnutrition related to chronic illness (advanced dementia) as evidenced by severe fat depletion,severe muscle depletion.  GOAL:   Patient will meet greater than or equal to 90% of their needs  MONITOR:   PO intake,Supplement acceptance,Labs,Weight trends  REASON FOR ASSESSMENT:   Consult Hip fracture protocol  ASSESSMENT:   87 y.o. female with medical history of advanced dementia and HTN. She resides at [facility] and presented to the ED with complaints of R hip pain. She uses a walker but had not been using it recently; no known falls. Xray in the ED showed acute, impacted R femoral fx and on 3/27 she underwent R hip hemiarthroplasty.  Patient is POD #1. Diet advanced from NPO to Regular yesterday at 1320. No intakes have been documented during this admission.  Patient sitting in the chair with no family or visitors present. Patient mumbled incoherently throughout visit but does not open eyes.   Weight on 3/26 was 92 lb and PTA the most recently documented weight was on 03/26/17 when she weighed 120 lb.    Labs reviewed; BUN: 25 mg/dl, creatinine: 1.01 mg/dl, Ca: 8.3 mg/dl, GFR: 53 ml/min. Medications reviewed; 100 mg colace BID, 17 g miralax/day.  IVF; NS @ 75 ml/hr.    NUTRITION - FOCUSED PHYSICAL EXAM:  Flowsheet Row Most Recent Value  Orbital Region Moderate depletion  Upper Arm Region Severe depletion  Thoracic and Lumbar Region Unable to assess  Buccal Region Severe depletion  Temple Region Moderate depletion  Clavicle Bone Region Severe depletion   Clavicle and Acromion Bone Region Severe depletion  Scapular Bone Region Severe depletion  Dorsal Hand Severe depletion  Patellar Region Severe depletion  Anterior Thigh Region Unable to assess  Posterior Calf Region Severe depletion  Edema (RD Assessment) None  Hair Reviewed  Eyes Unable to assess  Mouth Unable to assess  Skin Reviewed  Nails Reviewed       Diet Order:   Diet Order            Diet regular Room service appropriate? Yes; Fluid consistency: Thin  Diet effective now                 EDUCATION NEEDS:   No education needs have been identified at this time  Skin:  Skin Assessment: Skin Integrity Issues: Skin Integrity Issues:: Incisions Incisions: R hip (3/27)  Last BM:  PTA/unknown  Height:   Ht Readings from Last 1 Encounters:  05/03/20 5\' 1"  (1.549 m)    Weight:   Wt Readings from Last 1 Encounters:  05/03/20 41.7 kg    Estimated Nutritional Needs:  Kcal:  1400-1600 kcal Protein:  70-80 grams Fluid:  >/= 1.5 L/day     Jarome Matin, MS, RD, LDN, CNSC Inpatient Clinical Dietitian RD pager # available in [facility]  After hours/weekend pager # available in [facility]

## 2020-05-05 NOTE — Progress Notes (Signed)
PROGRESS NOTE  Amanda Randall  DOB: 28-Oct-1927  PCP: Thurman Coyer, MD GXQ:[id number]  DOA: 05/03/2020  LOS: 2 days   Chief Complaint  Patient presents with  . Hip Pain    Brief narrative: Amanda Randall is a 87 y.o. female with PMH significant for advanced dementia, hypertension from [facility] who presented to the ED with complaints of right hip pain.   Patient was in her normal state of health until around lunchtime on the day of admission when she started complaining of right hip pain.  She has lately been not using her walker.  No known falls.  In the ED, patient was afebrile, hemodynamically stable, hypoxic to 84%. X-ray showed acute, impacted, right subcapital femoral neck fracture. Orthopedics Dr. Lyla Glassing was consulted. Patient was admitted to hospitalist service. 3/27, patient underwent right hip hemiarthroplasty.  Subjective: Patient was seen and examined this morning. Elderly Caucasian female.  Opens eyes on verbal command.  Cheerful but making nonpurposeful movements.  Has mittens in her hand.  Unable to answer orientation questions.  Assessment/Plan: Acute impacted right subcapital femoral neck fracture -Probably osteoporotic in nature.  No known history of fall. -2/27 patient underwent right hip hemiarthroplasty by orthopedic surgery Dr. Lyla Glassing.   -DVT prophylaxis and pain management per orthopedics post procedure -Poor oral intake.  Started normal saline at 75 mill per hour.  Acute respiratory failure with hypoxia -Brief episode in the ED probably after dose of fentanyl.  Currently back on room air  Acute metabolic encephalopathy Advanced dementia -Currently more confused because of postsurgical status, pain medicines, hospitalization.  -Mittens applied. -Continue Namenda, Celexa, Remeron, rivastigmine.  Valium on hold. -High risk of worsening delirium.  Fall precautions  Essential hypertension -Continue to monitor blood pressure on  Lopressor  Mobility: PT eval pending Code Status:   Code Status: Full Code  Nutritional status: Body mass index is 17.38 kg/m.     Diet Order            Diet regular Room service appropriate? Yes; Fluid consistency: Thin  Diet effective now                 DVT prophylaxis: enoxaparin (LOVENOX) injection 30 mg Start: 05/05/20 0800 SCDs Start: 05/04/20 1320   Antimicrobials:  None Fluid: Normal saline at 75 mill per hour Consultants: Orthopedics Family Communication:  None at bedside  Status is: Inpatient Remains inpatient appropriate because: Postop day 1, pending PT eval.   Dispo: The patient is from: Home              Anticipated d/c is to: Home versus rehab, pending PT eval              Patient currently is not medically stable to d/c.   Difficult to place patient No       Infusions:  . sodium chloride 75 mL/hr at 05/05/20 1039  . methocarbamol (ROBAXIN) IV      Scheduled Meds: . Chlorhexidine Gluconate Cloth  6 each Topical Daily  . citalopram  20 mg Oral Daily  . docusate sodium  100 mg Oral BID  . enoxaparin (LOVENOX) injection  30 mg Subcutaneous Q24H  . memantine  10 mg Oral BID  . metoprolol tartrate  12.5 mg Oral BID  . mirtazapine  7.5 mg Oral QHS  . polyethylene glycol  17 g Oral Daily  . rivastigmine  3 mg Oral BID    Antimicrobials: Anti-infectives (From admission, onward)   Start  Dose/Rate Route Frequency Ordered Stop   05/04/20 1400  ceFAZolin (ANCEF) IVPB 2g/100 mL premix        2 g 200 mL/hr over 30 Minutes Intravenous Every 6 hours 05/04/20 1319 05/04/20 2202   05/04/20 0600  ceFAZolin (ANCEF) IVPB 2g/100 mL premix        2 g 200 mL/hr over 30 Minutes Intravenous On call to O.R. 05/03/20 1504 05/04/20 0858   05/04/20 0600  ceFAZolin (ANCEF) 3 g in dextrose 5 % 50 mL IVPB  Status:  Discontinued        3 g 100 mL/hr over 30 Minutes Intravenous On call to O.R. 05/03/20 1504 05/03/20 1510      PRN  meds: HYDROcodone-acetaminophen, HYDROmorphone (DILAUDID) injection, menthol-cetylpyridinium **OR** phenol, methocarbamol **OR** methocarbamol (ROBAXIN) IV, metoCLOPramide **OR** metoCLOPramide (REGLAN) injection, ondansetron **OR** ondansetron (ZOFRAN) IV   Objective: Vitals:   05/05/20 0543 05/05/20 0833  BP: 138/77 110/65  Pulse: 76 72  Resp: 18 20  Temp: 98.6 F (37 C) 98.3 F (36.8 C)  SpO2: (!) 87% 97%    Intake/Output Summary (Last 24 hours) at 05/05/2020 1055 Last data filed at 05/05/2020 0550 Gross per 24 hour  Intake 200 ml  Output 300 ml  Net -100 ml   Filed Weights   05/03/20 1458  Weight: 41.7 kg   Weight change:  Body mass index is 17.38 kg/m.   Physical Exam: General exam: elderly Caucasian female with dementia at baseline.  Not in pain.   Skin: No rashes, lesions or ulcers. HEENT: Atraumatic, normocephalic, no obvious bleeding Lungs: Clear to auscultation bilaterally CVS: Regular rate and rhythm, no murmur GI/Abd soft, nontender, nondistended, bowel sound present CNS: Opens eyes on verbal command, cheerfully demented, mild restlessness. Psychiatry: Cheerful Extremities: No pedal edema, no calf tenderness  Data Review: I have personally reviewed the laboratory data and studies available.  Recent Labs  Lab 05/03/20 1255 05/04/20 0500 05/05/20 0426  WBC 11.7* 8.9 6.6  NEUTROABS 10.6*  --   --   HGB 15.2* 14.1 12.0  HCT 47.4* 43.6 38.1  MCV 87.6 86.5 89.0  PLT 187 158 120*   Recent Labs  Lab 05/03/20 1255 05/04/20 0500 05/05/20 0426  NA 139 141 139  K 4.2 4.1 4.0  CL 102 105 106  CO2 27 27 27   GLUCOSE 111* 122* 106*  BUN 22 24* 25*  CREATININE 0.85 0.81 1.01*  CALCIUM 9.3 9.3 8.3*    F/u labs ordered Unresulted Labs (From admission, onward)          Start     Ordered   05/11/20 0500  Creatinine, serum  (enoxaparin (LOVENOX)  CrCl < 30 mL/min)  Weekly,   R     Comments: while on enoxaparin therapy.    05/04/20 1319   05/05/20  0500  CBC  Daily,   R      05/04/20 1319   05/05/20 9030  Basic metabolic panel  Daily,   R      05/04/20 1319          Signed, Terrilee Croak, MD [facility] 05/05/2020

## 2020-05-05 NOTE — NC FL2 (Signed)
[place] LEVEL OF CARE SCREENING TOOL     IDENTIFICATION  Patient Name: Amanda Randall Birthdate: 24-Nov-1927 Sex: female Admission Date (Current Location): 05/03/2020  [facility] and [place] Number:  Herbalist and Address:  [facility],  [place] [address] [place]      Provider Number: [id number]  Attending Physician Name and Address:  Terrilee Croak, MD  Relative Name and Phone Number:  Fredda Hammed brother [phone]    Current Level of Care: Hospital Recommended Level of Care: [place] Prior Approval Number:    Date Approved/Denied:   PASRR Number:    Discharge Plan: SNF    Current Diagnoses: Patient Active Problem List   Diagnosis Date Noted  . Hip fracture ([place]) 05/03/2020  . Essential hypertension 05/03/2020  . Dementia without behavioral disturbance ([place]) 05/03/2020    Orientation RESPIRATION BLADDER Height & Weight     Self  Normal Incontinent Weight: 41.7 kg Height:  5\' 1"  (154.9 cm)  BEHAVIORAL SYMPTOMS/MOOD NEUROLOGICAL BOWEL NUTRITION STATUS      Incontinent Diet (Regular)  AMBULATORY STATUS COMMUNICATION OF NEEDS Skin   Limited Assist   Surgical wounds (R hip surgical incision site)                       Personal Care Assistance Level of Assistance  Bathing,Feeding,Dressing Bathing Assistance: Limited assistance Feeding assistance: Limited assistance Dressing Assistance: Limited assistance     Functional Limitations Info  Sight,Hearing,Speech Sight Info: Adequate Hearing Info: Adequate Speech Info: Adequate    SPECIAL CARE FACTORS FREQUENCY  PT (By licensed PT),OT (By licensed OT)     PT Frequency: 5x week OT Frequency: 5x week            Contractures Contractures Info: Not present    Additional Factors Info  Code Status,Allergies Code Status Info:  (Full) Allergies Info:  (NKA)           Current Medications (05/05/2020):  This is the current hospital active  medication list Current Facility-Administered Medications  Medication Dose Route Frequency Provider Last Rate Last Admin  . 0.9 %  sodium chloride infusion   Intravenous Continuous Terrilee Croak, MD 75 mL/hr at 05/05/20 1039 New Bag at 05/05/20 1039  . Chlorhexidine Gluconate Cloth 2 % PADS 6 each  6 each Topical Daily Dahal, Marlowe Aschoff, MD   6 each at 05/04/20 1615  . citalopram (CELEXA) tablet 20 mg  20 mg Oral Daily Rod Can, MD   20 mg at 05/05/20 1039  . docusate sodium (COLACE) capsule 100 mg  100 mg Oral BID Rod Can, MD   100 mg at 05/05/20 1034  . enoxaparin (LOVENOX) injection 30 mg  30 mg Subcutaneous Q24H Rod Can, MD   30 mg at 05/05/20 3532  . HYDROcodone-acetaminophen (NORCO/VICODIN) 5-325 MG per tablet 1 tablet  1 tablet Oral Q6H PRN Rod Can, MD   1 tablet at 05/05/20 0559  . HYDROmorphone (DILAUDID) injection 0.5 mg  0.5 mg Intravenous Q4H PRN Swinteck, Aaron Edelman, MD      . memantine [facility]) tablet 10 mg  10 mg Oral BID Rod Can, MD   10 mg at 05/05/20 1037  . menthol-cetylpyridinium (CEPACOL) lozenge 3 mg  1 lozenge Oral PRN Swinteck, Aaron Edelman, MD       Or  . phenol (CHLORASEPTIC) mouth spray 1 spray  1 spray Mouth/Throat PRN Swinteck, Aaron Edelman, MD      . methocarbamol (ROBAXIN) tablet 500  mg  500 mg Oral Q6H PRN Rod Can, MD       Or  . methocarbamol (ROBAXIN) 500 mg in dextrose 5 % 50 mL IVPB  500 mg Intravenous Q6H PRN Swinteck, Aaron Edelman, MD      . metoCLOPramide (REGLAN) tablet 5-10 mg  5-10 mg Oral Q8H PRN Swinteck, Aaron Edelman, MD       Or  . metoCLOPramide (REGLAN) injection 5-10 mg  5-10 mg Intravenous Q8H PRN Swinteck, Aaron Edelman, MD      . metoprolol tartrate (LOPRESSOR) tablet 12.5 mg  12.5 mg Oral BID Rod Can, MD   12.5 mg at 05/05/20 1038  . mirtazapine (REMERON) tablet 7.5 mg  7.5 mg Oral QHS Rod Can, MD   7.5 mg at 05/04/20 2217  . ondansetron (ZOFRAN) tablet 4 mg  4 mg Oral Q6H PRN Swinteck, Aaron Edelman, MD       Or  . ondansetron  (ZOFRAN) injection 4 mg  4 mg Intravenous Q6H PRN Swinteck, Aaron Edelman, MD      . polyethylene glycol (MIRALAX / GLYCOLAX) packet 17 g  17 g Oral Daily Rod Can, MD   17 g at 05/05/20 1039  . rivastigmine (EXELON) capsule 3 mg  3 mg Oral BID Rod Can, MD   3 mg at 05/05/20 1039     Discharge Medications: Please see discharge summary for a list of discharge medications.  Relevant Imaging Results:  Relevant Lab Results:   Additional Information SS#[id number]  Saoirse Legere, Juliann Pulse, RN

## 2020-05-05 NOTE — Evaluation (Signed)
Physical Therapy Evaluation Patient Details Name: Amanda Randall MRN: [id number] DOB: Sep 02, 1927 Today's Date: 05/05/2020   History of Present Illness  Pt is a 87 y.o. female from [place] admitted 05/03/20 for Right hip hemiarthroplasty, direct anterior approach after a fall.  PMH includes: advanced dementia, hypertension.  Clinical Impression  Pt admitted with above diagnosis.  Pt currently with functional limitations due to the deficits listed below (see PT Problem List). Pt will benefit from skilled PT to increase their independence and safety with mobility to allow discharge to the venue listed below.  Pt pleasantly confused however participated well with multimodal cues.  Pt min-mod assist for mobility currently however was able to ambulate 80 feet today with RW.  Pt would benefit from SNF upon d/c prior to return to ALF.     Follow Up Recommendations SNF    Equipment Recommendations  None recommended by PT    Recommendations for Other Services       Precautions / Restrictions Precautions Precautions: Fall Precaution Comments: dementia Restrictions Weight Bearing Restrictions: No RLE Weight Bearing: Weight bearing as tolerated      Mobility  Bed Mobility Overal bed mobility: Needs Assistance Bed Mobility: Supine to Sit     Supine to sit: Mod assist;HOB elevated;+2 for safety/equipment     General bed mobility comments: cues for sequencing, use of bed pad to progress hips to EOB, assist for trunk elevation    Transfers Overall transfer level: Needs assistance Equipment used: Rolling walker (2 wheeled) Transfers: Sit to/from Stand Sit to Stand: Min assist;+2 safety/equipment;From elevated surface         General transfer comment: from bed x2, assist with hand placement on RW, assist to rise and steady  Ambulation/Gait Ambulation/Gait assistance: Min assist;+2 safety/equipment Gait Distance (Feet): 80 Feet Assistive device: Rolling walker (2 wheeled) Gait  Pattern/deviations: Step-to pattern;Decreased weight shift to right;Antalgic;Decreased stance time - right     General Gait Details: multimodal cues for sequence and RW positioning, pt requiring assist manuevering RW, recliner following for safety  Stairs            Wheelchair Mobility    Modified Rankin (Stroke Patients Only)       Balance Overall balance assessment: Needs assistance Sitting-balance support: Single extremity supported;Feet supported Sitting balance-Leahy Scale: Fair     Standing balance support: Bilateral upper extremity supported Standing balance-Leahy Scale: Poor Standing balance comment: benefits from BUE support on RW                             Pertinent Vitals/Pain Pain Assessment: Faces Faces Pain Scale: Hurts little more Pain Location: R hip surgical site Pain Descriptors / Indicators: Discomfort;Grimacing (pt rubbing area with her right hand) Pain Intervention(s): Monitored during session;Repositioned    Home Living Family/patient expects to be discharged to:: Skilled nursing facility                 Additional Comments: from [name]    Prior Function Level of Independence: Needs assistance   Gait / Transfers Assistance Needed: uses RW at baseline (but has not been using it recently per brother)  ADL's / Homemaking Assistance Needed: assist from staff  Comments: Pt unreliable historian, PLOF gleaned from chart review     Hand Dominance        Extremity/Trunk Assessment   Upper Extremity Assessment Upper Extremity Assessment: Overall WFL for tasks assessed    Lower Extremity Assessment  Lower Extremity Assessment: RLE deficits/detail RLE Deficits / Details: requiring assist for bed mobility due to pain, observed anticipated post op weakness RLE Coordination: decreased gross motor    Cervical / Trunk Assessment Cervical / Trunk Assessment: Kyphotic  Communication   Communication: No  difficulties  Cognition Arousal/Alertness: Awake/alert Behavior During Therapy: WFL for tasks assessed/performed Overall Cognitive Status: History of cognitive impairments - at baseline                                 General Comments: baseline advanced dementia, oriented to self only this session - but very sweet and pleasant      General Comments General comments (skin integrity, edema, etc.): Pt very pleasant and starting singing hymns at the end of session - left hymn playlist on in room    Exercises     Assessment/Plan    PT Assessment Patient needs continued PT services  PT Problem List Decreased strength;Decreased mobility;Decreased activity tolerance;Decreased balance;Decreased knowledge of use of DME;Pain;Decreased safety awareness       PT Treatment Interventions Gait training;DME instruction;Therapeutic exercise;Balance training;Functional mobility training;Therapeutic activities;Patient/family education    PT Goals (Current goals can be found in the Care Plan section)  Acute Rehab PT Goals Patient Stated Goal: "to go for a ride" PT Goal Formulation: Patient unable to participate in goal setting Time For Goal Achievement: 05/19/20 Potential to Achieve Goals: Good    Frequency Min 3X/week   Barriers to discharge        Co-evaluation PT/OT/SLP Co-Evaluation/Treatment: Yes Reason for Co-Treatment: Necessary to address cognition/behavior during functional activity;For patient/therapist safety;To address functional/ADL transfers PT goals addressed during session: Mobility/safety with mobility;Proper use of DME OT goals addressed during session: ADL's and self-care;Proper use of Adaptive equipment and DME       AM-PAC PT "6 Clicks" Mobility  Outcome Measure Help needed turning from your back to your side while in a flat bed without using bedrails?: A Lot Help needed moving from lying on your back to sitting on the side of a flat bed without using  bedrails?: A Lot Help needed moving to and from a bed to a chair (including a wheelchair)?: A Lot Help needed standing up from a chair using your arms (e.g., wheelchair or bedside chair)?: A Lot Help needed to walk in hospital room?: A Little Help needed climbing 3-5 steps with a railing? : A Lot 6 Click Score: 13    End of Session Equipment Utilized During Treatment: Gait belt Activity Tolerance: Patient tolerated treatment well Patient left: in chair;with call bell/phone within reach;with chair alarm set;with SCD's reapplied (mitts and floor mat repositioned) Nurse Communication: Mobility status PT Visit Diagnosis: Difficulty in walking, not elsewhere classified (R26.2)    Time: 1610-9604 PT Time Calculation (min) (ACUTE ONLY): 23 min   Charges:   PT Evaluation $PT Eval Low Complexity: 1 Low        Kati PT, DPT Acute Rehabilitation Services Pager: [phone] Office: [phone]  York Ram E 05/05/2020, 12:21 PM

## 2020-05-05 NOTE — Plan of Care (Signed)
  Problem: Health Behavior/Discharge Planning: Goal: Ability to manage health-related needs will improve Outcome: Progressing   Problem: Clinical Measurements: Goal: Will remain free from infection Outcome: Progressing   Problem: Activity: Goal: Risk for activity intolerance will decrease Outcome: Progressing   Problem: Nutrition: Goal: Adequate nutrition will be maintained Outcome: Progressing   Problem: Pain Managment: Goal: General experience of comfort will improve Outcome: Progressing   

## 2020-05-05 NOTE — TOC Initial Note (Addendum)
Transition of Care [facility]) - Initial/Assessment Note    Patient Details  Name: Amanda Randall MRN: [id number] Date of Birth: 02-Mar-1927  Transition of Care [facility]) CM/SW Contact:    Dessa Phi, RN Phone Number: 05/05/2020, 10:48 AM  Clinical Narrative:  [facility],uses rollator. POD#1 R hip hemi-Likely need higher level-OT-recc SNF.await PT recc. 3p-faxed out await bed offers, & pasrr.                 Expected Discharge Plan: Skilled Nursing Facility Barriers to Discharge: Continued Medical Work up   Patient Goals and CMS Choice Patient states their goals for this hospitalization and ongoing recovery are:: go to rehab CMS Medicare.gov Compare Post Acute Care list provided to:: Patient Represenative (must comment) Milta Deiters brother [phone]) Choice offered to / list presented to : Adult Children  Expected Discharge Plan and Services Expected Discharge Plan: [place]   Discharge Planning Services: CM Consult Post Acute Care Choice: [place] Living arrangements for the past 2 months: [place]                                      Prior Living Arrangements/Services Living arrangements for the past 2 months: [place] Lives with:: Facility Resident   Do you feel safe going back to the place where you live?: Yes      Need for Family Participation in Patient Care: No (Comment) Care giver support system in place?: Yes (comment) Current home services: DME (rollator) Criminal Activity/Legal Involvement Pertinent to Current Situation/Hospitalization: No - Comment as needed  Activities of Daily Living Home Assistive Devices/Equipment: Other (Comment) (lives in [place] care unit) ADL Screening (condition at time of admission) Patient's cognitive ability adequate to safely complete daily activities?: No Is the patient deaf or have difficulty hearing?: No Does the patient have difficulty seeing,  even when wearing glasses/contacts?: No Does the patient have difficulty concentrating, remembering, or making decisions?: Yes Patient able to express need for assistance with ADLs?: No Does the patient have difficulty dressing or bathing?: Yes Independently performs ADLs?: No Communication: Needs assistance Is this a change from baseline?: Pre-admission baseline Dressing (OT): Needs assistance Is this a change from baseline?: Pre-admission baseline Grooming: Needs assistance Is this a change from baseline?: Pre-admission baseline Feeding: Needs assistance Is this a change from baseline?: Pre-admission baseline Bathing: Needs assistance Is this a change from baseline?: Pre-admission baseline Toileting: Needs assistance Is this a change from baseline?: Pre-admission baseline In/Out Bed: Needs assistance Is this a change from baseline?: Change from baseline, expected to last <3 days Walks in Home: Independent Does the patient have difficulty walking or climbing stairs?: Yes Weakness of Legs: Right Weakness of Arms/Hands: None  Permission Sought/Granted Permission sought to share information with : Case Manager Permission granted to share information with : Yes, Verbal Permission Granted  Share Information with NAME: Case manager           Emotional Assessment Appearance:: Appears stated age Attitude/Demeanor/Rapport: Gracious Affect (typically observed): Accepting Orientation: : Oriented to Self Alcohol / Substance Use: Not Applicable Psych Involvement: No (comment)  Admission diagnosis:  Hip fracture ([place]) [S72.009A] Patient Active Problem List   Diagnosis Date Noted  . Hip fracture ([place]) 05/03/2020  . Essential hypertension 05/03/2020  . Dementia without behavioral disturbance ([place]) 05/03/2020   PCP:  Thurman Coyer, MD Pharmacy:  No Pharmacies Listed  Social Determinants of Health (SDOH) Interventions    Readmission Risk Interventions No flowsheet data  found.

## 2020-05-05 NOTE — Progress Notes (Signed)
    Subjective:  Patient is pleasantly confused and is humming to herself. She does open eyes when called by name and is able to follow command to wiggle toes. She is in mittens to prevent the removal of IV lines and bandages  Objective:   VITALS:   Vitals:   05/05/20 0002 05/05/20 0543 05/05/20 0833 05/05/20 1238  BP: 104/63 138/77 110/65 (!) 90/59  Pulse: 73 76 72 70  Resp: 18 18 20 18   Temp: 98.7 F (37.1 C) 98.6 F (37 C) 98.3 F (36.8 C) 98.8 F (37.1 C)  TempSrc: Oral Oral Oral Oral  SpO2: 96% (!) 87% 97% 91%  Weight:      Height:        NAD ABD soft Neurovascular intact Sensation intact distally Intact pulses distally Dorsiflexion/Plantar flexion intact Incision: dressing C/D/I   Lab Results  Component Value Date   WBC 6.6 05/05/2020   HGB 12.0 05/05/2020   HCT 38.1 05/05/2020   MCV 89.0 05/05/2020   PLT 120 (L) 05/05/2020   BMET    Component Value Date/Time   NA 139 05/05/2020 0426   K 4.0 05/05/2020 0426   CL 106 05/05/2020 0426   CO2 27 05/05/2020 0426   GLUCOSE 106 (H) 05/05/2020 0426   BUN 25 (H) 05/05/2020 0426   CREATININE 1.01 (H) 05/05/2020 0426   CALCIUM 8.3 (L) 05/05/2020 0426   GFRNONAA 53 (L) 05/05/2020 0426   GFRAA >60 10/14/2018 1257     Assessment/Plan: 1 Day Post-Op   Principal Problem:   Hip fracture (HCC) Active Problems:   Essential hypertension   Dementia without behavioral disturbance ([place])   WBAT with walker DVT ppx: Lovenox, SCDs, TEDS   ASA 81mg  BID at D/C PO pain control, try to avoid narcotics when possible PT/OT Dispo: Pending     Dorothyann Peng 05/05/2020, 12:41 PM [facility] is now [employer] [address], [place], [place] [address] Phone: [phone] [email or web address] Facebook  [employer]

## 2020-05-05 NOTE — NC FL2 (Signed)
[place] LEVEL OF CARE SCREENING TOOL     IDENTIFICATION  Patient Name: Amanda Randall Birthdate: Sep 06, 1927 Sex: female Admission Date (Current Location): 05/03/2020  [facility] and [place] Number:  Herbalist and Address:  [facility],  [place] [address], [place]      Provider Number: [id number]  Attending Physician Name and Address:  Terrilee Croak, MD  Relative Name and Phone Number:  Fredda Hammed brother [phone]    Current Level of Care: Hospital Recommended Level of Care: [place] Prior Approval Number:    Date Approved/Denied:   PASRR Number: [id number]  Discharge Plan: SNF    Current Diagnoses: Patient Active Problem List   Diagnosis Date Noted  . Hip fracture ([place]) 05/03/2020  . Essential hypertension 05/03/2020  . Dementia without behavioral disturbance ([place]) 05/03/2020    Orientation RESPIRATION BLADDER Height & Weight     Self  Normal Incontinent Weight: 41.7 kg Height:  5\' 1"  (154.9 cm)  BEHAVIORAL SYMPTOMS/MOOD NEUROLOGICAL BOWEL NUTRITION STATUS      Incontinent Diet (Regular)  AMBULATORY STATUS COMMUNICATION OF NEEDS Skin   Limited Assist   Surgical wounds (R hip surgical incision site)                       Personal Care Assistance Level of Assistance  Bathing,Feeding,Dressing Bathing Assistance: Limited assistance Feeding assistance: Limited assistance Dressing Assistance: Limited assistance     Functional Limitations Info  Sight,Hearing,Speech Sight Info: Adequate Hearing Info: Adequate Speech Info: Adequate    SPECIAL CARE FACTORS FREQUENCY  PT (By licensed PT),OT (By licensed OT)     PT Frequency: 5x week OT Frequency: 5x week            Contractures Contractures Info: Not present    Additional Factors Info  Code Status,Allergies Code Status Info:  (Full) Allergies Info:  (NKA)           Current Medications (05/05/2020):  This is the current  hospital active medication list Current Facility-Administered Medications  Medication Dose Route Frequency Provider Last Rate Last Admin  . 0.9 %  sodium chloride infusion   Intravenous Continuous Terrilee Croak, MD 75 mL/hr at 05/05/20 1039 New Bag at 05/05/20 1039  . Chlorhexidine Gluconate Cloth 2 % PADS 6 each  6 each Topical Daily Dahal, Marlowe Aschoff, MD   6 each at 05/05/20 1451  . citalopram (CELEXA) tablet 20 mg  20 mg Oral Daily Rod Can, MD   20 mg at 05/05/20 1039  . docusate sodium (COLACE) capsule 100 mg  100 mg Oral BID Rod Can, MD   100 mg at 05/05/20 1034  . enoxaparin (LOVENOX) injection 30 mg  30 mg Subcutaneous Q24H Rod Can, MD   30 mg at 05/05/20 1950  . feeding supplement (ENSURE ENLIVE / ENSURE PLUS) liquid 237 mL  237 mL Oral BID BM Dahal, Binaya, MD      . HYDROcodone-acetaminophen (NORCO/VICODIN) 5-325 MG per tablet 1 tablet  1 tablet Oral Q6H PRN Rod Can, MD   1 tablet at 05/05/20 0559  . HYDROmorphone (DILAUDID) injection 0.5 mg  0.5 mg Intravenous Q4H PRN Swinteck, Aaron Edelman, MD      . memantine [facility]) tablet 10 mg  10 mg Oral BID Rod Can, MD   10 mg at 05/05/20 1037  . menthol-cetylpyridinium (CEPACOL) lozenge 3 mg  1 lozenge Oral PRN Rod Can, MD       Or  .  phenol (CHLORASEPTIC) mouth spray 1 spray  1 spray Mouth/Throat PRN Swinteck, Aaron Edelman, MD      . methocarbamol (ROBAXIN) tablet 500 mg  500 mg Oral Q6H PRN Swinteck, Aaron Edelman, MD       Or  . methocarbamol (ROBAXIN) 500 mg in dextrose 5 % 50 mL IVPB  500 mg Intravenous Q6H PRN Swinteck, Aaron Edelman, MD      . metoCLOPramide (REGLAN) tablet 5-10 mg  5-10 mg Oral Q8H PRN Swinteck, Aaron Edelman, MD       Or  . metoCLOPramide (REGLAN) injection 5-10 mg  5-10 mg Intravenous Q8H PRN Swinteck, Aaron Edelman, MD      . metoprolol tartrate (LOPRESSOR) tablet 12.5 mg  12.5 mg Oral BID Rod Can, MD   12.5 mg at 05/05/20 1038  . mirtazapine (REMERON) tablet 7.5 mg  7.5 mg Oral QHS Swinteck, Aaron Edelman, MD    7.5 mg at 05/04/20 2217  . multivitamin with minerals tablet 1 tablet  1 tablet Oral Daily Dahal, Binaya, MD      . ondansetron (ZOFRAN) tablet 4 mg  4 mg Oral Q6H PRN Swinteck, Aaron Edelman, MD       Or  . ondansetron (ZOFRAN) injection 4 mg  4 mg Intravenous Q6H PRN Swinteck, Aaron Edelman, MD      . polyethylene glycol (MIRALAX / GLYCOLAX) packet 17 g  17 g Oral Daily Rod Can, MD   17 g at 05/05/20 1039  . rivastigmine (EXELON) capsule 3 mg  3 mg Oral BID Rod Can, MD   3 mg at 05/05/20 1039     Discharge Medications: Please see discharge summary for a list of discharge medications.  Relevant Imaging Results:  Relevant Lab Results:   Additional Information SS#[id number]  Bryndan Bilyk, Juliann Pulse, RN

## 2020-05-05 NOTE — Care Management (Signed)
Transition of Care (TOC) -30 day Note       Patient Details   Name:Amanda Randall   TGP:[id number]  Date of Birth:[birth year]     Transition of Care [facility]) CM/SW Contact   Name:Shaterria Sager [facility]   Phone Number:[phone]  Date:05/05/2020  Time:2:30p     MUST KG:[id number]     To Whom it May Concern:     Please be advised that the above patient will require a short-term nursing home stay, anticipated 30 days or less rehabilitation and strengthening. The plan is for return home.

## 2020-05-06 LAB — CBC
HCT: 37.3 % (ref 36.0–46.0)
Hemoglobin: 11.4 g/dL — ABNORMAL LOW (ref 12.0–15.0)
MCH: 27.7 pg (ref 26.0–34.0)
MCHC: 30.6 g/dL (ref 30.0–36.0)
MCV: 90.8 fL (ref 80.0–100.0)
Platelets: 124 10*3/uL — ABNORMAL LOW (ref 150–400)
RBC: 4.11 MIL/uL (ref 3.87–5.11)
RDW: 13.3 % (ref 11.5–15.5)
WBC: 6.4 10*3/uL (ref 4.0–10.5)
nRBC: 0 % (ref 0.0–0.2)

## 2020-05-06 LAB — BASIC METABOLIC PANEL
Anion gap: 9 (ref 5–15)
BUN: 22 mg/dL (ref 8–23)
CO2: 24 mmol/L (ref 22–32)
Calcium: 8.1 mg/dL — ABNORMAL LOW (ref 8.9–10.3)
Chloride: 106 mmol/L (ref 98–111)
Creatinine, Ser: 0.83 mg/dL (ref 0.44–1.00)
GFR, Estimated: >60 mL/min (ref >60)
Glucose, Bld: 102 mg/dL — ABNORMAL HIGH (ref 70–99)
Potassium: 3.5 mmol/L (ref 3.5–5.1)
Sodium: 139 mmol/L (ref 135–145)

## 2020-05-06 NOTE — Plan of Care (Signed)
  Problem: Clinical Measurements: Goal: Postoperative complications will be avoided or minimized Outcome: Progressing   Problem: Activity: Goal: Ability to ambulate and perform ADLs will improve Outcome: Progressing   Problem: Pain Management: Goal: Pain level will decrease Outcome: Progressing   Problem: Pain Managment: Goal: General experience of comfort will improve Outcome: Progressing   Problem: Elimination: Goal: Will not experience complications related to urinary retention Outcome: Progressing   Problem: Clinical Measurements: Goal: Will remain free from infection Outcome: Progressing

## 2020-05-06 NOTE — Care Management Important Message (Signed)
Important Message  Patient Details IM Letter placed in Patient's room. Name: Amanda Randall MRN: [id number] Date of Birth: 17-Dec-1927   Medicare Important Message Given:  Yes     Kerin Salen 05/06/2020, 1:32 PM

## 2020-05-06 NOTE — Progress Notes (Signed)
PROGRESS NOTE  Amanda Randall  DOB: July 23, 1927  PCP: Thurman Coyer, MD TUU:[id number]  DOA: 05/03/2020  LOS: 3 days   Chief Complaint  Patient presents with  . Hip Pain    Brief narrative: Amanda Randall is a 87 y.o. female with PMH significant for advanced dementia, hypertension from [facility] who presented to the ED with complaints of right hip pain.   Patient was in her normal state of health until around lunchtime on the day of admission when she started complaining of right hip pain.  She has lately been not using her walker.  No known falls.  In the ED, patient was afebrile, hemodynamically stable, hypoxic to 84%. X-ray showed acute, impacted, right subcapital femoral neck fracture. Orthopedics Dr. Lyla Glassing was consulted. Patient was admitted to hospitalist service. 3/27, patient underwent right hip hemiarthroplasty.  Subjective: Patient was seen and examined this morning. Elderly Caucasian female with dementia, cheerful.  Has mittens.  More awake today. Remains on IV fluid.  Retaining urine per RN  Assessment/Plan: Acute impacted right subcapital femoral neck fracture -Probably osteoporotic in nature.  No known history of fall. -2/27 patient underwent right hip hemiarthroplasty by orthopedic surgery Dr. Lyla Glassing.   -DVT prophylaxis and pain management per orthopedics post procedure -Poor oral intake. Currently on normal saline. Reduce the rate to 50 mill per hour.  Acute respiratory failure with hypoxia -Brief episode in the ED probably after dose of fentanyl.  Currently back on room air  Acute metabolic encephalopathy Advanced dementia -Currently more confused because of postsurgical status, pain medicines, hospitalization.  -Mittens in place. -Continue Namenda, Celexa, Remeron, rivastigmine.  Valium on hold. -High risk of worsening delirium.  Fall precautions  Essential hypertension -Continue to monitor blood pressure on Lopressor  Urine  retention -Retaining urine last 24 hours.  In and out catheter per policy.  May need Foley catheter insertion as well.  Continue to monitor.  Mobility: PT eval obtained.  SNF recommended Code Status:   Code Status: Full Code  Nutritional status: Body mass index is 17.38 kg/m. Nutrition Problem: Severe Malnutrition Etiology: chronic illness (advanced dementia) Signs/Symptoms: severe fat depletion,severe muscle depletion Diet Order            Diet regular Room service appropriate? Yes; Fluid consistency: Thin  Diet effective now                 DVT prophylaxis: enoxaparin (LOVENOX) injection 30 mg Start: 05/05/20 0800 SCDs Start: 05/04/20 1320   Antimicrobials:  None Fluid: Normal saline at 50 mill per hour Consultants: Orthopedics Family Communication:  None at bedside  Status is: Inpatient Remains inpatient appropriate because: Pending SNF   Dispo: The patient is from: Home              Anticipated d/c is to: SNF              Patient currently is not medically stable to d/c.   Difficult to place patient No       Infusions:  . sodium chloride 75 mL/hr at 05/05/20 2243  . methocarbamol (ROBAXIN) IV      Scheduled Meds: . Chlorhexidine Gluconate Cloth  6 each Topical Daily  . citalopram  20 mg Oral Daily  . docusate sodium  100 mg Oral BID  . enoxaparin (LOVENOX) injection  30 mg Subcutaneous Q24H  . feeding supplement  237 mL Oral BID BM  . memantine  10 mg Oral BID  . metoprolol tartrate  12.5 mg  Oral BID  . mirtazapine  7.5 mg Oral QHS  . multivitamin with minerals  1 tablet Oral Daily  . polyethylene glycol  17 g Oral Daily  . rivastigmine  3 mg Oral BID    Antimicrobials: Anti-infectives (From admission, onward)   Start     Dose/Rate Route Frequency Ordered Stop   05/04/20 1400  ceFAZolin (ANCEF) IVPB 2g/100 mL premix        2 g 200 mL/hr over 30 Minutes Intravenous Every 6 hours 05/04/20 1319 05/04/20 2202   05/04/20 0600  ceFAZolin (ANCEF) IVPB  2g/100 mL premix        2 g 200 mL/hr over 30 Minutes Intravenous On call to O.R. 05/03/20 1504 05/04/20 0858   05/04/20 0600  ceFAZolin (ANCEF) 3 g in dextrose 5 % 50 mL IVPB  Status:  Discontinued        3 g 100 mL/hr over 30 Minutes Intravenous On call to O.R. 05/03/20 1504 05/03/20 1510      PRN meds: HYDROcodone-acetaminophen, HYDROmorphone (DILAUDID) injection, menthol-cetylpyridinium **OR** phenol, methocarbamol **OR** methocarbamol (ROBAXIN) IV, metoCLOPramide **OR** metoCLOPramide (REGLAN) injection, ondansetron **OR** ondansetron (ZOFRAN) IV   Objective: Vitals:   05/05/20 2215 05/06/20 0528  BP: (!) 142/86 123/71  Pulse: (!) 103 89  Resp: 18 20  Temp: 97.8 F (36.6 C) 97.9 F (36.6 C)  SpO2: 94% 90%    Intake/Output Summary (Last 24 hours) at 05/06/2020 1124 Last data filed at 05/06/2020 0600 Gross per 24 hour  Intake 1566.96 ml  Output 320 ml  Net 1246.96 ml   Filed Weights   05/03/20 1458  Weight: 41.7 kg   Weight change:  Body mass index is 17.38 kg/m.   Physical Exam: General exam: elderly Caucasian female with dementia at baseline.  Not in pain.   Skin: No rashes, lesions or ulcers. HEENT: Atraumatic, normocephalic, no obvious bleeding Lungs: Clear to auscultation bilaterally CVS: Regular rate and rhythm, no murmur GI/Abd soft, nontender, nondistended, bowel sound present CNS: Awake, alert, unable to have a conversation, cheerfully demented, mild restlessness. Psychiatry: Cheerful Extremities: No pedal edema, no calf tenderness  Data Review: I have personally reviewed the laboratory data and studies available.  Recent Labs  Lab 05/03/20 1255 05/04/20 0500 05/05/20 0426 05/06/20 0504  WBC 11.7* 8.9 6.6 6.4  NEUTROABS 10.6*  --   --   --   HGB 15.2* 14.1 12.0 11.4*  HCT 47.4* 43.6 38.1 37.3  MCV 87.6 86.5 89.0 90.8  PLT 187 158 120* 124*   Recent Labs  Lab 05/03/20 1255 05/04/20 0500 05/05/20 0426 05/06/20 0504  NA 139 141 139 139  K  4.2 4.1 4.0 3.5  CL 102 105 106 106  CO2 27 27 27 24   GLUCOSE 111* 122* 106* 102*  BUN 22 24* 25* 22  CREATININE 0.85 0.81 1.01* 0.83  CALCIUM 9.3 9.3 8.3* 8.1*    F/u labs ordered Unresulted Labs (From admission, onward)          Start     Ordered   05/11/20 0500  Creatinine, serum  (enoxaparin (LOVENOX)  CrCl < 30 mL/min)  Weekly,   R     Comments: while on enoxaparin therapy.    05/04/20 1319   05/06/20 1119  SARS CORONAVIRUS 2 (TAT 6-24 HRS) Nasopharyngeal Nasopharyngeal Swab  Once,   R       Question Answer Comment  Is this test for diagnosis or screening Screening   Symptomatic for COVID-19 as defined by CDC No  Hospitalized for COVID-19 No   Admitted to ICU for COVID-19 No   Previously tested for COVID-19 Yes   Resident in a congregate (group) care setting No   Employed in healthcare setting Unknown   Pregnant No   Has patient completed COVID vaccination(s) (2 doses of Pfizer/Moderna 1 dose of [employer]) Unknown      05/06/20 1119   05/05/20 0500  CBC  Daily,   R      05/04/20 1319          Signed, Terrilee Croak, MD [facility] 05/06/2020

## 2020-05-06 NOTE — TOC Progression Note (Signed)
Transition of Care [facility]) - Progression Note    Patient Details  Name: Amanda Randall MRN: [id number] Date of Birth: [birth year]  Transition of Care [facility]) CM/SW Contact  Aleera Gilcrease, Juliann Pulse, RN Phone Number: 05/06/2020, 10:18 AM  Clinical Narrative: Provided Neil(brother) bed offers-await choice. Will need auth, & covid. 1. 0.9 mi [place] [facility] and [place], [place] [address] [phone] Overall rating Much above average 2. 1.3 mi [place] at [place], [place] [address] [phone] Overall rating Much below average 3. 2.3 mi [place], [place] [address] [phone] Overall rating Much below average 4. 2.7 mi [place] [address] [place], [place] [address] [phone] Overall rating Below average 5. 3 mi [facility], [place] [address] [phone] Overall rating Below average 6. 3.1 [place], [place] [address] [phone] Overall rating Below average 7. 3.4 mi [facility] at the [place], [place] [address] [phone] Overall rating Below average 8. 3.4 [place], [place] [address] [phone] Overall rating Above average 9. [place] [address] [place], [place] [address] [phone] Overall rating Much below average 10. 4.1 mi [facility] at [place], [place] [address] [phone] Overall rating Much above average 11. 4.2 [place], [place] [address] [phone] Overall rating Much above average 12. 4.2 mi [facility] [place], [place] [address] [phone] Overall rating Below average 13. [place], [place] [address] [phone] Overall rating Above average 14. 8.2 [place], [place] [address] [phone] Overall rating Above average 15. 8.4 mi [facility] [address] [place], [place] [address] [phone] Overall rating Average 16. 8.7 mi [facility] [place], [place] [address] [phone] Overall rating Much above average 17. 9.6 mi [facility] and [place], [place] [address] [phone] Overall rating Average 18. 10.3 mi [place] at [facility] [address] [place], [place] [address] [phone] Overall rating Much above average 19. 12.1 mi [facility] [address] [place], [place] [address] [phone] Overall rating Much below average 20. 12.2 [facility] [address] [place], [place] [address] [phone] Overall rating Much below average 21. 13.7 mi The [place] [address] [place], [place] [address] [phone] Overall rating Much below average 22. 13.8 mi [facility] [place], [place] [address] [phone] Overall rating Below average 23. 14.4 mi [place] and [facility] [place], [place] [address] [phone] Overall rating Below average 24. 14.5 [place] [address] [place], [place] [address] [phone] Overall rating Much below average 25. 16.5 mi [place] [address] [place], [place] [address] [phone] Overall rating Above average 26. 17.2 mi [facility] [place], [place] [address] [phone] Overall rating Much above average 27. 48.2 [place]  [place], [place] [address] [phone] Overall rating Much below average 28. 18.4 mi [place], [place] [address] [phone] Overall rating Average 29. 19.2 mi [facility] and [facility] [address] [place], [place] [address] [phone] Overall rating Much below average 30. 20.5 mi [address] at the [facility] at [facility], [place] [address] [phone] Overall rating Much above average 31. 21.2 mi [address] [place], [place] [address] [phone] Overall rating Below average 32. 21.2 [place], [place] [address] [phone] Overall rating Average 33. 21.4 [address] [place], [place] [address] [phone] Overall rating Much above average 34. [place] [address] [place], [place] [address] [phone] Overall rating Below average 35. 22.2 [place], [place] [address] [phone] Overall rating Much above average 36. 22.4 mi [facility] [place], [place] [address] [phone] Overall rating Much below average 37. 23.2 mi [facility] [address] [place], [place] [address] [phone] Overall rating Below average 38. 23.8 mi [facility] [address] [place], [place] [address] [phone] Overall rating Above average 39. 23.9 [facility] [place], [place] [address] [phone] Overall rating Much below average 40. [place], [place]  [address] [phone] Overall rating Much below average 41. [place] [address] [place], [place] [address] [phone] Overall rating Average 42. 24.3 [facility] [address] [place], [place] [address] [phone] Overall rating Much below average 43. 24.3 mi [facility] [place], [place] [address] [phone] Overall rating Below average 44. 24.8 mi [place] [address] [place], [place] [address] [phone] Overall rating Above average 45. 24.9 mi The [place] at [place],  [address] [phone] Overall rating       Expected Discharge Plan: [place] Barriers to Discharge: Continued Medical Work up  Expected Discharge Plan and Services Expected Discharge Plan: [place]   Discharge Planning Services: CM Consult Post Acute Care Choice: [place] arrangements for the past 2 months: [place]                                       Social Determinants of Health (SDOH) Interventions    Readmission Risk Interventions No flowsheet data found.

## 2020-05-07 LAB — CBC
HCT: 32.3 % — ABNORMAL LOW (ref 36.0–46.0)
Hemoglobin: 10.3 g/dL — ABNORMAL LOW (ref 12.0–15.0)
MCH: 28.5 pg (ref 26.0–34.0)
MCHC: 31.9 g/dL (ref 30.0–36.0)
MCV: 89.2 fL (ref 80.0–100.0)
Platelets: 129 10*3/uL — ABNORMAL LOW (ref 150–400)
RBC: 3.62 MIL/uL — ABNORMAL LOW (ref 3.87–5.11)
RDW: 13.2 % (ref 11.5–15.5)
WBC: 5.4 10*3/uL (ref 4.0–10.5)
nRBC: 0 % (ref 0.0–0.2)

## 2020-05-07 LAB — SARS CORONAVIRUS 2 (TAT 6-24 HRS): SARS Coronavirus 2: NEGATIVE

## 2020-05-07 MED ORDER — METOPROLOL TARTRATE 5 MG/5ML IV SOLN
2.5000 mg | INTRAVENOUS | Status: AC | PRN
  Administered 2020-05-07 (×2): 2.5 mg via INTRAVENOUS
  Filled 2020-05-07: qty 5

## 2020-05-07 MED ORDER — SODIUM CHLORIDE 0.9 % IV BOLUS
250.0000 mL | Freq: Once | INTRAVENOUS | Status: AC
  Administered 2020-05-07: 250 mL via INTRAVENOUS

## 2020-05-07 MED ORDER — DILTIAZEM LOAD VIA INFUSION
2.5000 mg | Freq: Once | INTRAVENOUS | Status: AC
  Administered 2020-05-07: 2.5 mg via INTRAVENOUS
  Filled 2020-05-07: qty 3

## 2020-05-07 MED ORDER — DILTIAZEM HCL-DEXTROSE 125-5 MG/125ML-% IV SOLN (PREMIX)
5.0000 mg/h | INTRAVENOUS | Status: DC
  Administered 2020-05-07: 5 mg/h via INTRAVENOUS
  Filled 2020-05-07: qty 125

## 2020-05-07 MED ORDER — DIGOXIN 0.25 MG/ML IJ SOLN
0.5000 mg | Freq: Once | INTRAMUSCULAR | Status: AC
  Administered 2020-05-07: 0.5 mg via INTRAVENOUS
  Filled 2020-05-07 (×2): qty 2

## 2020-05-07 MED ORDER — ACETAMINOPHEN 325 MG PO TABS
650.0000 mg | ORAL_TABLET | Freq: Four times a day (QID) | ORAL | Status: DC | PRN
  Administered 2020-05-07 – 2020-05-08 (×3): 650 mg via ORAL
  Filled 2020-05-07 (×3): qty 2

## 2020-05-07 MED ORDER — POTASSIUM CHLORIDE CRYS ER 20 MEQ PO TBCR
40.0000 meq | EXTENDED_RELEASE_TABLET | Freq: Once | ORAL | Status: AC
  Administered 2020-05-07: 40 meq via ORAL
  Filled 2020-05-07: qty 2

## 2020-05-07 NOTE — Progress Notes (Signed)
Physical Therapy Treatment Patient Details Name: Amanda Randall MRN: [id number] DOB: 1927/03/25 Today's Date: 05/07/2020    History of Present Illness Pt is a 87 y.o. female from [place] admitted 05/03/20 for Right hip hemiarthroplasty, direct anterior approach after a fall.  PMH includes: advanced dementia, hypertension.    PT Comments    Patient remains pleasantly confused and agreeable to mobilize with therapy. She requires min assist for transfers and gait and repeated multimodal cues to sequence tasks safely and maintain focus. Pt required assist to avoid obstacles in hallway during gait and was limited by pain in Rt thigh/hip with increased distance. EOS pt left in recliner with alarm on, ice applied, and no complaints. She will continue to benefit from skilled PT interventions to progress mobility as able, continue to recommend SNF level follow up.    Follow Up Recommendations  SNF     Equipment Recommendations  None recommended by PT    Recommendations for Other Services       Precautions / Restrictions Precautions Precautions: Fall Precaution Comments: dementia Restrictions Weight Bearing Restrictions: No RLE Weight Bearing: Weight bearing as tolerated    Mobility  Bed Mobility               General bed mobility comments: pt OOB in recliner    Transfers Overall transfer level: Needs assistance Equipment used: Rolling walker (2 wheeled) Transfers: Sit to/from Stand Sit to Stand: Min assist         General transfer comment: repeated multimodal cues for safe hand placement with RW. pt initiated power up but assist needed to complete rise. bil UE's used on arm rest to rise from walker.  Ambulation/Gait Ambulation/Gait assistance: Min assist;+2 safety/equipment Gait Distance (Feet): 160 Feet Assistive device: Rolling walker (2 wheeled) Gait Pattern/deviations: Step-to pattern;Decreased weight shift to right;Antalgic;Decreased stance time - right;Trunk  flexed Gait velocity: decr   General Gait Details: multimodal cues for safe position to RW, pt required multiple occasions of assist to manage walker and avoid obstacles in hallway. pt with tendency to drift Rt in hallway. recliner follow for safety. pt stopped intermittently to rub Rt thigh due to pain.   Stairs             Wheelchair Mobility    Modified Rankin (Stroke Patients Only)       Balance Overall balance assessment: Needs assistance Sitting-balance support: Single extremity supported;Feet supported Sitting balance-Leahy Scale: Fair     Standing balance support: Bilateral upper extremity supported Standing balance-Leahy Scale: Poor Standing balance comment: benefits from BUE support on RW                            Cognition Arousal/Alertness: Awake/alert Behavior During Therapy: WFL for tasks assessed/performed Overall Cognitive Status: History of cognitive impairments - at baseline                                 General Comments: baseline advanced dementia, oriented to self only this session - but very sweet and pleasant      Exercises      General Comments        Pertinent Vitals/Pain Pain Assessment: Faces Faces Pain Scale: Hurts little more Pain Location: R hip surgical site Pain Descriptors / Indicators: Discomfort;Grimacing (with activity) Pain Intervention(s): Limited activity within patient's tolerance;Premedicated before session;Ice applied    Home Living  Prior Function            PT Goals (current goals can now be found in the care plan section) Acute Rehab PT Goals PT Goal Formulation: Patient unable to participate in goal setting Time For Goal Achievement: 05/19/20 Potential to Achieve Goals: Good Progress towards PT goals: Progressing toward goals    Frequency    Min 3X/week      PT Plan Current plan remains appropriate    Co-evaluation               AM-PAC PT "6 Clicks" Mobility   Outcome Measure  Help needed turning from your back to your side while in a flat bed without using bedrails?: A Lot Help needed moving from lying on your back to sitting on the side of a flat bed without using bedrails?: A Lot Help needed moving to and from a bed to a chair (including a wheelchair)?: A Lot Help needed standing up from a chair using your arms (e.g., wheelchair or bedside chair)?: A Lot Help needed to walk in hospital room?: A Lot Help needed climbing 3-5 steps with a railing? : A Lot 6 Click Score: 12    End of Session Equipment Utilized During Treatment: Gait belt Activity Tolerance: Patient tolerated treatment well Patient left: in chair;with call bell/phone within reach;with chair alarm set;with SCD's reapplied Nurse Communication: Mobility status PT Visit Diagnosis: Difficulty in walking, not elsewhere classified (R26.2)     Time: 5361-4431 PT Time Calculation (min) (ACUTE ONLY): 21 min  Charges:  $Gait Training: 8-22 mins                     Verner Mould, DPT Acute Rehabilitation Services Office [phone] Pager [phone]     Jacques Navy 05/07/2020, 2:40 PM

## 2020-05-07 NOTE — TOC Progression Note (Signed)
Transition of Care [facility]) - Progression Note    Patient Details  Name: Amanda Randall MRN: [id number] Date of Birth: [birth year]  Transition [facility]) CM/SW Contact  Loris Winrow, Juliann Pulse, RN Phone Number: 05/07/2020, 11:39 AM  Clinical Narrative: Conard Novak Georgina Pillion following-CM initiated Josem Kaufmann for SNF-[address]-await auth. covid neg 3/29.      Expected Discharge Plan: Skilled Nursing Facility Barriers to Discharge: Ship broker  Expected Discharge Plan and Services Expected Discharge Plan: [place]   Discharge Planning Services: CM Consult Post Acute Care Choice: [place] Living arrangements for the past 2 months: [place]                                       Social Determinants of Health (SDOH) Interventions    Readmission Risk Interventions No flowsheet data found.

## 2020-05-07 NOTE — Progress Notes (Signed)
PROGRESS NOTE   Amanda Randall  HAL:[id number]    DOB: Aug 12, 1927    DOA: 05/03/2020  PCP: Thurman Coyer, MD   I have briefly reviewed patients previous medical records in [facility].  Chief Complaint  Patient presents with  . Hip Pain    Brief Narrative:  87 year old female with medical history significant for but not limited to advanced dementia, hypertension, resident of [facility], presented to the ED with complaints of right hip pain.  No reported falls.  Hypoxic in the ED to 84%.  Admitted for acute impacted right subcapital femoral neck fracture.  Orthopedics consulted and s/p right hip hemiarthroplasty 3/27.  Hypoxia resolved.  Hospital course complicated by acute urinary retention, resolved and paroxysmal SVT on 3/30.   Assessment & Plan:  Principal Problem:   Hip fracture ([place]) Active Problems:   Essential hypertension   Dementia without behavioral disturbance (HCC)   Protein-calorie malnutrition, severe   Acute impacted right subcapital femoral neck fracture No known history of fall.  Probably osteoporotic in nature.  Will need to check vitamin D levels and bone density as outpatient.  Orthopedics were consulted and patient is s/p right hip hemiarthroplasty by Dr. Lyla Glassing on 3/27.  Postop DVT prophylaxis and pain management per orthopedics.  Outpatient follow-up with them in 2 weeks.  Postop acute blood loss anemia: Expected.  Follow CBC and transfuse if hemoglobin 7 g or less.  Thrombocytopenia Likely related to acute blood loss anemia.  Stable.  Acute respiratory failure with hypoxia May be due to fentanyl in the ED.  Resolved.  Advanced dementia with acute metabolic encephalopathy Reportedly was more confused early on and stable possibly due to postsurgical status, pain meds, hospital delirium.  This seems to have resolved and patient may be back to her baseline.  Mittens were discontinued 3/30 morning.  Continue Namenda, Celexa, Remeron,  rivastigmine.  Valium on hold.  At risk for recurrent delirium.  Essential hypertension Continue metoprolol 12.5 mg twice daily.  Became hypotensive with SBP in the 80s following metoprolol 2.5 mg IV x1 for SVT.  Treating with IV normal saline bolus 500 mL and monitor closely.  SBP up to 108 mmHg.  SVT Noted a brief episode of nonsustained SVT up to 162 bpm on telemetry this morning at around 6:07 AM.  Then again this afternoon, RN reported sustained SVT up to 140s.  EKG reviewed.  Trial of metoprolol 2.5 mg IV every 5 minutes x2 doses.  Transiently hypotensive.  Treating with IV normal saline bolus.  Continue telemetry. Discussed with cardiologist on-call.  Recommends digoxin 0.5 mg x 1 dose, Cardizem IV 2.5 mg bolus x1 followed by Cardizem drip which can be stopped if she reverts to sinus rhythm or if becomes hypotensive.  Cardiology will consult in a.m. Requested 2D echo and TSH.  Acute urinary retention Seems to have resolved.  Body mass index is 17.38 kg/m.  Nutritional Status Nutrition Problem: Severe Malnutrition Etiology: chronic illness (advanced dementia) Signs/Symptoms: severe fat depletion,severe muscle depletion Interventions: Ensure Enlive (each supplement provides 350kcal and 20 grams of protein),Magic cup,MVI    DVT prophylaxis: enoxaparin (LOVENOX) injection 30 mg Start: 05/05/20 0800 SCDs Start: 05/04/20 1320     Code Status: Full Code Family Communication: None at bedside Disposition:  Status is: Inpatient  Remains inpatient appropriate because:Inpatient level of care appropriate due to severity of illness   Dispo: The patient is from: ALF  Anticipated d/c is to: SNF              Patient currently is not medically stable to d/c.   Difficult to place patient No        Consultants:   Orthopedics  Procedures:   S/p right hip hemiarthroplasty 3/27.  Antimicrobials:    Anti-infectives (From admission, onward)   Start     Dose/Rate Route  Frequency Ordered Stop   05/04/20 1400  ceFAZolin (ANCEF) IVPB 2g/100 mL premix        2 g 200 mL/hr over 30 Minutes Intravenous Every 6 hours 05/04/20 1319 05/04/20 2202   05/04/20 0600  ceFAZolin (ANCEF) IVPB 2g/100 mL premix        2 g 200 mL/hr over 30 Minutes Intravenous On call to O.R. 30-May-2020 1504 05/04/20 0858   05/04/20 0600  ceFAZolin (ANCEF) 3 g in dextrose 5 % 50 mL IVPB  Status:  Discontinued        3 g 100 mL/hr over 30 Minutes Intravenous On call to O.R. 05/30/20 1504 2020/05/30 1510        Subjective:  Seen early this morning.  Pleasantly confused.  Denied complaints.  Subsequently this afternoon, RN reported asymptomatic persistent SVT in the 140s.  Objective:   Vitals:   05/07/20 1714 05/07/20 1719 05/07/20 1738 05/07/20 1756  BP: (!) 82/67 (!) 85/64 106/80 (!) 109/93  Pulse: (!) 140 (!) 139 (!) 149 (!) 150  Resp:      Temp:      TempSrc:      SpO2:      Weight:      Height:        General exam: Elderly female, small built and frail lying comfortably propped up in bed without distress. Respiratory system: Clear to auscultation. Respiratory effort normal. Cardiovascular system: S1 & S2 heard, RRR. No JVD, murmurs, rubs, gallops or clicks. No pedal edema.  Telemetry reviewed this morning had shown brief episode of nonsustained SVT up to 162 at around 6:07 AM but otherwise was in sinus rhythm. Gastrointestinal system: Abdomen is nondistended, soft and nontender. No organomegaly or masses felt. Normal bowel sounds heard. Central nervous system: Alert and oriented only to self. No focal neurological deficits. Extremities: Symmetric 5 x 5 power.  Right hip surgical site dressing clean and dry.  Expected mild patchy bruising of right thigh. Skin: No rashes, lesions or ulcers Psychiatry: Judgement and insight impaired. Mood & affect pleasantly confused.     Data Reviewed:   I have personally reviewed following labs and imaging studies   CBC: Recent Labs  Lab  May 30, 2020 1255 05/04/20 0500 05/05/20 0426 05/06/20 0504 05/07/20 0546  WBC 11.7*   < > 6.6 6.4 5.4  NEUTROABS 10.6*  --   --   --   --   HGB 15.2*   < > 12.0 11.4* 10.3*  HCT 47.4*   < > 38.1 37.3 32.3*  MCV 87.6   < > 89.0 90.8 89.2  PLT 187   < > 120* 124* 129*   < > = values in this interval not displayed.    Basic Metabolic Panel: Recent Labs  Lab 05/04/20 0500 05/05/20 0426 05/06/20 0504  NA 141 139 139  K 4.1 4.0 3.5  CL 105 106 106  CO2 27 27 24   GLUCOSE 122* 106* 102*  BUN 24* 25* 22  CREATININE 0.81 1.01* 0.83  CALCIUM 9.3 8.3* 8.1*    Liver Function Tests: No results for  input(s): AST, ALT, ALKPHOS, BILITOT, PROT, ALBUMIN in the last 168 hours.  CBG: No results for input(s): GLUCAP in the last 168 hours.  Microbiology Studies:   Recent Results (from the past 240 hour(s))  Resp Panel by RT-PCR (Flu A&B, Covid) Nasopharyngeal Swab     Status: None   Collection Time: 05/03/20  1:05 PM   Specimen: Nasopharyngeal Swab; Nasopharyngeal(NP) swabs in vial transport medium  Result Value Ref Range Status   SARS Coronavirus 2 by RT PCR NEGATIVE NEGATIVE Final    Comment: (NOTE) SARS-CoV-2 target nucleic acids are NOT DETECTED.  The SARS-CoV-2 RNA is generally detectable in upper respiratory specimens during the acute phase of infection. The lowest concentration of SARS-CoV-2 viral copies this assay can detect is 138 copies/mL. A negative result does not preclude SARS-Cov-2 infection and should not be used as the sole basis for treatment or other patient management decisions. A negative result may occur with  improper specimen collection/handling, submission of specimen other than nasopharyngeal swab, presence of viral mutation(s) within the areas targeted by this assay, and inadequate number of viral copies(<138 copies/mL). A negative result must be combined with clinical observations, patient history, and epidemiological information. The expected result is  Negative.  Fact Sheet for Patients:  EntrepreneurPulse.com.au  Fact Sheet for Healthcare Providers:  IncredibleEmployment.be  This test is no t yet approved or cleared by the [place] FDA and  has been authorized for detection and/or diagnosis of SARS-CoV-2 by FDA under an Emergency Use Authorization (EUA). This EUA will remain  in effect (meaning this test can be used) for the duration of the COVID-19 declaration under Section 564(b)(1) of the Act, 21 U.S.C.section 360bbb-3(b)(1), unless the authorization is terminated  or revoked sooner.       Influenza A by PCR NEGATIVE NEGATIVE Final   Influenza B by PCR NEGATIVE NEGATIVE Final    Comment: (NOTE) The Xpert Xpress SARS-CoV-2/FLU/RSV plus assay is intended as an aid in the diagnosis of influenza from Nasopharyngeal swab specimens and should not be used as a sole basis for treatment. Nasal washings and aspirates are unacceptable for Xpert Xpress SARS-CoV-2/FLU/RSV testing.  Fact Sheet for Patients: EntrepreneurPulse.com.au  Fact Sheet for Healthcare Providers: IncredibleEmployment.be  This test is not yet approved or cleared by the [place] FDA and has been authorized for detection and/or diagnosis of SARS-CoV-2 by FDA under an Emergency Use Authorization (EUA). This EUA will remain in effect (meaning this test can be used) for the duration of the COVID-19 declaration under Section 564(b)(1) of the Act, 21 U.S.C. section 360bbb-3(b)(1), unless the authorization is terminated or revoked.  Performed at [facility], [place] [address] [place], [place] [address]   Surgical PCR screen     Status: None   Collection Time: 05/03/20  6:07 PM   Specimen: Nasal Mucosa; Nasal Swab  Result Value Ref Range Status   MRSA, PCR NEGATIVE NEGATIVE Final   Staphylococcus aureus NEGATIVE NEGATIVE Final    Comment: (NOTE) The Xpert SA Assay  (FDA approved for NASAL specimens in patients 43 years of age and older), is one component of a comprehensive surveillance program. It is not intended to diagnose infection nor to guide or monitor treatment. Performed at [facility], [place] [address], [place], [place] [address]   SARS CORONAVIRUS 2 (TAT 6-24 HRS) Nasopharyngeal Nasopharyngeal Swab     Status: None   Collection Time: 05/06/20  5:50 PM   Specimen: Nasopharyngeal Swab  Result Value Ref Range Status  SARS Coronavirus 2 NEGATIVE NEGATIVE Final    Comment: (NOTE) SARS-CoV-2 target nucleic acids are NOT DETECTED.  The SARS-CoV-2 RNA is generally detectable in upper and lower respiratory specimens during the acute phase of infection. Negative results do not preclude SARS-CoV-2 infection, do not rule out co-infections with other pathogens, and should not be used as the sole basis for treatment or other patient management decisions. Negative results must be combined with clinical observations, patient history, and epidemiological information. The expected result is Negative.  Fact Sheet for Patients: SugarRoll.be  Fact Sheet for Healthcare Providers: https://www.woods-mathews.com/  This test is not yet approved or cleared by the [place] FDA and  has been authorized for detection and/or diagnosis of SARS-CoV-2 by FDA under an Emergency Use Authorization (EUA). This EUA will remain  in effect (meaning this test can be used) for the duration of the COVID-19 declaration under Se ction 564(b)(1) of the Act, 21 U.S.C. section 360bbb-3(b)(1), unless the authorization is terminated or revoked sooner.  Performed at [facility], [place] [address] [place], [place] [address]      Radiology Studies:  No results found.   Scheduled Meds:   . citalopram  20 mg Oral Daily  . docusate sodium  100 mg Oral BID  . enoxaparin (LOVENOX) injection  30 mg  Subcutaneous Q24H  . feeding supplement  237 mL Oral BID BM  . memantine  10 mg Oral BID  . metoprolol tartrate  12.5 mg Oral BID  . mirtazapine  7.5 mg Oral QHS  . multivitamin with minerals  1 tablet Oral Daily  . polyethylene glycol  17 g Oral Daily  . rivastigmine  3 mg Oral BID    Continuous Infusions:   . sodium chloride 50 mL/hr at 05/07/20 0621  . methocarbamol (ROBAXIN) IV       LOS: 4 days     Vernell Leep, MD, [place], [facility]. [facility]    To contact the attending provider between 7A-7P or the covering provider during after hours 7P-7A, please log into the web site [email or web address] and access using universal [place] password for that web site. If you do not have the password, please call the hospital operator.  05/07/2020, 6:15 PM

## 2020-05-07 NOTE — Progress Notes (Signed)
One dose of IV Metoprolol 2.5 mg given PRN per Dr. Algis Liming. After 5 minutes, pt's HR remains 140's, SVT on telemetry. SBP dropped to 80's. Dr. Algis Liming made aware.

## 2020-05-07 NOTE — TOC Progression Note (Signed)
Transition of Care [facility]) - Progression Note    Patient Details  Name: Amanda Randall MRN: [id number] Date of Birth: 10-17-1927  [facility] CM/SW Contact  Aleczander Fandino, Juliann Pulse, RN Phone Number: 05/07/2020, 10:27 AM  Clinical Narrative:  Wonda Amis chose [facility] 24hrs of d/c prior starting auth.Will need covid 24hrs of d/c.     Expected Discharge Plan: [place] Barriers to Discharge: Continued Medical Work up  Expected Discharge Plan and Services Expected Discharge Plan: [place]   Discharge Planning Services: CM Consult Post Acute Care Choice: [place] Living arrangements for the past 2 months: [place]                                       Social Determinants of Health (SDOH) Interventions    Readmission Risk Interventions No flowsheet data found.

## 2020-05-07 NOTE — Progress Notes (Signed)
   05/07/20 1656  Assess: MEWS Score  Temp 99.7 F (37.6 C)  BP 101/65  Pulse Rate (!) 142  Resp (!) 23  SpO2 94 %  O2 Device Room Air  Assess: MEWS Score  MEWS Temp 0  MEWS Systolic 0  MEWS Pulse 3  MEWS RR 1  MEWS LOC 0  MEWS Score 4  MEWS Score Color Red  Assess: if the MEWS score is Yellow or Red  Were vital signs taken at a resting state? Yes  Focused Assessment Change from prior assessment (see assessment flowsheet)  Early Detection of Sepsis Score *See Row Information* Medium  MEWS guidelines implemented *See Row Information* Yes  Treat  MEWS Interventions Escalated (See documentation below);Administered prn meds/treatments  Pain Scale 0-10  Pain Score 0  Take Vital Signs  Increase Vital Sign Frequency  Red: Q 1hr X 4 then Q 4hr X 4, if remains red, continue Q 4hrs  Escalate  MEWS: Escalate Red: discuss with charge nurse/RN and provider, consider discussing with RRT  Notify: Charge Nurse/RN  Name of Charge Nurse/RN Notified Marissa Long, RN  Date Charge Nurse/RN Notified 05/07/20  Time Charge Nurse/RN Notified 1700  Notify: Provider  Provider Name/Title Dr. Algis Liming  Date Provider Notified 05/07/20  Time Provider Notified 1700  Notification Type Page  Notification Reason Change in status  Provider response See new orders  Date of Provider Response 05/07/20  Time of Provider Response 1705

## 2020-05-08 ENCOUNTER — Inpatient Hospital Stay (HOSPITAL_COMMUNITY): Payer: Medicare Other

## 2020-05-08 ENCOUNTER — Other Ambulatory Visit: Payer: Self-pay | Admitting: Cardiology

## 2020-05-08 ENCOUNTER — Encounter (HOSPITAL_COMMUNITY): Payer: Self-pay | Admitting: Internal Medicine

## 2020-05-08 DIAGNOSIS — I10 Essential (primary) hypertension: Secondary | ICD-10-CM

## 2020-05-08 DIAGNOSIS — E43 Unspecified severe protein-calorie malnutrition: Secondary | ICD-10-CM

## 2020-05-08 DIAGNOSIS — I471 Supraventricular tachycardia: Secondary | ICD-10-CM

## 2020-05-08 DIAGNOSIS — S72001D Fracture of unspecified part of neck of right femur, subsequent encounter for closed fracture with routine healing: Secondary | ICD-10-CM

## 2020-05-08 LAB — ECHOCARDIOGRAM COMPLETE
Area-P 1/2: 3.42 cm2
Height: 61 in
P 1/2 time: 353 msec
S' Lateral: 2.04 cm
Weight: 1472 oz

## 2020-05-08 LAB — BASIC METABOLIC PANEL
Anion gap: 6 (ref 5–15)
BUN: 13 mg/dL (ref 8–23)
CO2: 24 mmol/L (ref 22–32)
Calcium: 7.6 mg/dL — ABNORMAL LOW (ref 8.9–10.3)
Chloride: 109 mmol/L (ref 98–111)
Creatinine, Ser: 0.61 mg/dL (ref 0.44–1.00)
GFR, Estimated: >60 mL/min (ref >60)
Glucose, Bld: 112 mg/dL — ABNORMAL HIGH (ref 70–99)
Potassium: 4.3 mmol/L (ref 3.5–5.1)
Sodium: 139 mmol/L (ref 135–145)

## 2020-05-08 LAB — CBC
HCT: 30.5 % — ABNORMAL LOW (ref 36.0–46.0)
Hemoglobin: 9.6 g/dL — ABNORMAL LOW (ref 12.0–15.0)
MCH: 27.7 pg (ref 26.0–34.0)
MCHC: 31.5 g/dL (ref 30.0–36.0)
MCV: 88.2 fL (ref 80.0–100.0)
Platelets: 153 10*3/uL (ref 150–400)
RBC: 3.46 MIL/uL — ABNORMAL LOW (ref 3.87–5.11)
RDW: 13.2 % (ref 11.5–15.5)
WBC: 5.4 10*3/uL (ref 4.0–10.5)
nRBC: 0 % (ref 0.0–0.2)

## 2020-05-08 LAB — MAGNESIUM: Magnesium: 2 mg/dL (ref 1.7–2.4)

## 2020-05-08 LAB — TSH: TSH: 4.161 u[IU]/mL (ref 0.350–4.500)

## 2020-05-08 MED ORDER — METOPROLOL TARTRATE 25 MG PO TABS
25.0000 mg | ORAL_TABLET | Freq: Two times a day (BID) | ORAL | Status: DC
  Administered 2020-05-08: 25 mg via ORAL
  Filled 2020-05-08: qty 1

## 2020-05-08 MED ORDER — METOPROLOL TARTRATE 25 MG PO TABS: 25.0000 mg | ORAL_TABLET | Freq: Two times a day (BID) | ORAL | Status: DC

## 2020-05-08 NOTE — Discharge Summary (Addendum)
Physician Discharge Summary  Amanda Randall HFW:[id number] DOB: Nov 19, 1927  PCP: Thurman Coyer, MD  Admitted from: [facility] Discharged to: SNF  Admit date: 05/03/2020 Discharge date: 05/08/2020  Recommendations for Outpatient Follow-up:    Contact information for follow-up providers    Swinteck, Aaron Edelman, MD. Schedule an appointment as soon as possible for a visit in 2 weeks.   Specialty: Orthopedic Surgery Why: For wound re-check, For suture removal Contact information: [address]  [place] [address] [phone]        MD at SNF. Schedule an appointment as soon as possible for a visit.   Why: To be seen in 2 to 3 days with repeat labs (CBC & BMP).         Cloward, Dianna Rossetti, MD. Schedule an appointment as soon as possible for a visit.   Specialty: Internal Medicine Why: Upon discharge from SNF. Contact information: [address] [place] [address] [phone]        Sherran Needs, NP Follow up on 05/21/2020.   Specialties: Nurse Practitioner, Cardiology Why: at 1:30 Pm  and parking code number [id number]  Contact information: [place] [address] [phone]            Contact information for after-discharge care    Destination    [facility] .   Service: Skilled Nursing Contact information: [place]: N/A    Equipment/Devices: TBD at SNF    Discharge Condition: Improved and stable   Code Status: Full Code Diet recommendation:  Discharge Diet Orders (From admission, onward)    Start     Ordered   05/08/20 0000  Diet - low sodium heart healthy        05/08/20 1241           Discharge Diagnoses:  Principal Problem:   Hip fracture ([place]) Active Problems:   Essential hypertension   Dementia without behavioral disturbance (HCC)   Protein-calorie malnutrition, severe   Brief  Summary: 87 year old female with medical history significant for but not limited to advanced dementia, hypertension, resident of [facility], presented to the ED with complaints of right hip pain.  No reported falls.  Hypoxic in the ED to 84%.  Admitted for acute impacted right subcapital femoral neck fracture.  Orthopedics consulted and s/p right hip hemiarthroplasty 3/27.  Hypoxia resolved.  Hospital course complicated by acute urinary retention, resolved and tachyarrhythmia for which cardiology consulted.   Assessment & Plan:   Acute impacted right subcapital femoral neck fracture No known history of fall.  Probably osteoporotic in nature.  Will need to check vitamin D levels and bone density at SNF.  Orthopedics were consulted and patient is s/p right hip hemiarthroplasty by Dr. Lyla Glassing on 3/27.  Postop DVT prophylaxis and pain management per orthopedics (aspirin 81 mg twice daily x45 days as per orthopedics).  Weightbearing as tolerated with walker.  Minimize opioids as much as possible.  Leave Aquacel dressing until outpatient follow-up with orthopedics.  Outpatient follow-up with them in 2 weeks.  Postop acute blood loss anemia: Expected.  Hemoglobin  on admission 15.2.  This is gradually drifted to 10.3 today.  Follow CBC closely at SNF and transfuse if hemoglobin 7 g or less.  Thrombocytopenia Likely related to acute blood loss anemia.  Stable.  Follow  CBC closely at SNF.  Acute respiratory failure with hypoxia May be due to fentanyl in the ED.  Resolved.  Advanced dementia with acute metabolic encephalopathy Reportedly was more confused early on in admission possibly due to postsurgical status, pain meds, hospital delirium.  This seems to have resolved and patient may be back to her baseline.  Mittens were discontinued 3/30 morning.  Continue Namenda, Celexa, Remeron, rivastigmine.  Valium does not appear on her home med list at time of discharge.  At risk for recurrent  delirium.  Essential hypertension Metoprolol now increased to 25 mg twice daily to address the tachyarrhythmia below  Atrial tachycardia  On 05/07/2020, patient developed sustained tachycardia up to 150s for approximately 3 hours.  Trial of IV metoprolol 2.5 mg x 1 made her hypotensive with SBP in the 80s which improved after IV saline bolus.  After discussing with cardiology, she was given a one-time dose of digoxin 0.5 mg and started briefly on IV Cardizem drip.  She reverted to sinus rhythm at around 7:30 PM on 3/30.  Transiently again on day of discharge she had tachycardia up to 170s.  Cardiology consultation appreciated.  They feel that she has atrial tachycardia.  Lopressor increased from 12.5 mg twice daily to 25 mg twice daily.  They do not think that given her advanced dementia and behavioral abnormalities, that she will tolerate a Zio patch monitor outpatient to assess adequacy of beta-blocker therapy.  They will arrange outpatient follow-up in 1 to 2 weeks   Acute urinary retention Resolved  Body mass index is 17.38 kg/m.  Nutritional Status Nutrition Problem: Severe Malnutrition Etiology: chronic illness (advanced dementia) Signs/Symptoms: severe fat depletion,severe muscle depletion Interventions: Ensure Enlive (each supplement provides 350kcal and 20 grams of protein),Magic cup,MVI      Consultants:   Orthopedics Cardiology  Procedures:   S/p right hip hemiarthroplasty 3/27.    Discharge Instructions  Discharge Instructions    Call MD for:  difficulty breathing, headache or visual disturbances   Complete by: As directed    Call MD for:  extreme fatigue   Complete by: As directed    Call MD for:  persistant dizziness or light-headedness   Complete by: As directed    Call MD for:  persistant nausea and vomiting   Complete by: As directed    Call MD for:  redness, tenderness, or signs of infection (pain, swelling, redness, odor or green/yellow discharge  around incision site)   Complete by: As directed    Call MD for:  severe uncontrolled pain   Complete by: As directed    Call MD for:  temperature >100.4   Complete by: As directed    Diet - low sodium heart healthy   Complete by: As directed    Discharge instructions   Complete by: As directed    Total acetaminophen dose from all sources not to exceed 4 g/day   Increase activity slowly   Complete by: As directed    No wound care   Complete by: As directed        Medication List    TAKE these medications   acetaminophen 325 MG tablet Commonly known as: TYLENOL Take 650 mg by mouth every 6 (six) hours as needed for mild pain.   aspirin 81 MG chewable tablet Commonly known as: Aspirin Childrens Chew 1 tablet (81 mg total) by mouth 2 (two) times daily with a meal.   citalopram 20 MG tablet Commonly known as: CELEXA Take 20 mg  by mouth daily.   Ensure Take 1 Can by mouth 2 (two) times daily between meals. VANILLA   HYDROcodone-acetaminophen 5-325 MG tablet Commonly known as: NORCO/VICODIN Take 1 tablet by mouth every 6 (six) hours as needed for up to 7 days for moderate pain.   memantine 10 MG tablet Commonly known as: NAMENDA Take 10 mg by mouth 2 (two) times daily.   metoprolol tartrate 25 MG tablet Commonly known as: LOPRESSOR Take 1 tablet (25 mg total) by mouth 2 (two) times daily. HOLD FOR SYSTOLIC BLOOD PRESSURE LESS THAN 100 What changed:   how much to take  additional instructions   mirtazapine 15 MG tablet Commonly known as: REMERON Take 15 mg by mouth at bedtime.   multivitamin with minerals Tabs tablet Take 1 tablet by mouth daily.   polyethylene glycol 17 g packet Commonly known as: MIRALAX / GLYCOLAX Take 17 g by mouth daily.   rivastigmine 3 MG capsule Commonly known as: EXELON Take 3 mg by mouth 2 (two) times daily.   vitamin B-12 250 MCG tablet Commonly known as: CYANOCOBALAMIN Take 250 mcg by mouth daily.      No Known  Allergies    Procedures/Studies: DG Knee 2 Views Right  Result Date: 05/03/2020 CLINICAL DATA:  RIGHT knee pain following fall. EXAM: RIGHT KNEE - 1-2 VIEW COMPARISON:  None. FINDINGS: No acute fracture or dislocation. No joint effusion noted. Chondrocalcinosis identified. Mild tricompartment joint space narrowing noted. No focal bony lesions are present. IMPRESSION: 1. No acute abnormality. 2. Chondrocalcinosis and mild tricompartment degenerative changes. Electronically Signed   By: Margarette Canada M.D.   On: 05/03/2020 14:35   Pelvis Portable  Result Date: 05/04/2020 CLINICAL DATA:  Post RIGHT hip hemiarthroplasty EXAM: PORTABLE PELVIS 1-2 VIEWS COMPARISON:  Portable exam 1029 hours compared intraoperative images of 05/04/2020 FINDINGS: Osseous demineralization. RIGHT hip prosthesis identified without fracture or dislocation. Extensive atherosclerotic calcifications. Postsurgical changes of the soft tissues at the RIGHT hip region. IMPRESSION: RIGHT hip prosthesis without acute abnormalities. Electronically Signed   By: Lavonia Dana M.D.   On: 05/04/2020 12:51   DG Chest Port 1 View  Result Date: 05/03/2020 CLINICAL DATA:  RIGHT hip fracture.  Fall. EXAM: PORTABLE CHEST 1 VIEW COMPARISON:  10/14/2018 and prior radiographs FINDINGS: The cardiomediastinal silhouette is unremarkable. Elevated RIGHT hemidiaphragm again noted. There is no evidence of focal airspace disease, pulmonary edema, suspicious pulmonary nodule/mass, pleural effusion, or pneumothorax. No acute bony abnormalities are identified. Surgical clips in the LEFT axilla are again identified. IMPRESSION: No active disease. Electronically Signed   By: Margarette Canada M.D.   On: 05/03/2020 14:36   DG C-Arm 1-60 Min-No Report  Result Date: 05/04/2020 Fluoroscopy was utilized by the requesting physician.  No radiographic interpretation.   ECHOCARDIOGRAM COMPLETE  Result Date: 05/08/2020    ECHOCARDIOGRAM REPORT   Patient Name:   Amanda Randall  Date of Exam: 05/08/2020 Medical Rec #:  [id number]    Height:       61.0 in Accession #:    [id number]   Weight:       92.0 lb Date of Birth:  01-28-1928    BSA:          1.358 m Patient Age:    19 years     BP:           143/85 mmHg Patient Gender: F            HR:  83 bpm. Exam Location:  Inpatient Procedure: 2D Echo, Cardiac Doppler and Color Doppler Indications:    I47.1 SVT  History:        Patient has no prior history of Echocardiogram examinations.                 Risk Factors:Hypertension. Cancer.  Sonographer:    Jonelle Sidle Dance Referring Phys: [id number] Aurelia Gras D Dyane Broberg IMPRESSIONS  1. Left ventricular ejection fraction, by estimation, is 65 to 70%. The left ventricle has normal function. The left ventricle has no regional wall motion abnormalities. Left ventricular diastolic parameters are consistent with Grade II diastolic dysfunction (pseudonormalization). Elevated left ventricular end-diastolic pressure.  2. Right ventricular systolic function is normal. The right ventricular size is normal. There is normal pulmonary artery systolic pressure. The estimated right ventricular systolic pressure is 23.3 mmHg.  3. Left atrial size was mildly dilated.  4. The mitral valve is normal in structure. No evidence of mitral valve regurgitation. No evidence of mitral stenosis.  5. The aortic valve is tricuspid. Aortic valve regurgitation is moderate. Mild to moderate aortic valve sclerosis/calcification is present, without any evidence of aortic stenosis. Aortic regurgitation PHT measures 353 msec.  6. There is mild dilatation of the ascending aorta, measuring 38 mm.  7. The inferior vena cava is normal in size with <50% respiratory variability, suggesting right atrial pressure of 8 mmHg. FINDINGS  Left Ventricle: Left ventricular ejection fraction, by estimation, is 65 to 70%. The left ventricle has normal function. The left ventricle has no regional wall motion abnormalities. The left ventricular internal cavity  size was normal in size. There is  no left ventricular hypertrophy. Left ventricular diastolic parameters are consistent with Grade II diastolic dysfunction (pseudonormalization). Elevated left ventricular end-diastolic pressure. Right Ventricle: The right ventricular size is normal. No increase in right ventricular wall thickness. Right ventricular systolic function is normal. There is normal pulmonary artery systolic pressure. The tricuspid regurgitant velocity is 2.32 m/s, and  with an assumed right atrial pressure of 8 mmHg, the estimated right ventricular systolic pressure is 00.7 mmHg. Left Atrium: Left atrial size was mildly dilated. Right Atrium: Right atrial size was normal in size. Pericardium: There is no evidence of pericardial effusion. Mitral Valve: The mitral valve is normal in structure. Mild to moderate mitral annular calcification. No evidence of mitral valve regurgitation. No evidence of mitral valve stenosis. Tricuspid Valve: The tricuspid valve is normal in structure. Tricuspid valve regurgitation is mild . No evidence of tricuspid stenosis. Aortic Valve: The aortic valve is tricuspid. Aortic valve regurgitation is moderate. Aortic regurgitation PHT measures 353 msec. Mild to moderate aortic valve sclerosis/calcification is present, without any evidence of aortic stenosis. Pulmonic Valve: The pulmonic valve was normal in structure. Pulmonic valve regurgitation is trivial. No evidence of pulmonic stenosis. Aorta: The aortic root is normal in size and structure. There is mild dilatation of the ascending aorta, measuring 38 mm. Venous: The inferior vena cava is normal in size with less than 50% respiratory variability, suggesting right atrial pressure of 8 mmHg. IAS/Shunts: No atrial level shunt detected by color flow Doppler.  LEFT VENTRICLE PLAX 2D LVIDd:         3.21 cm  Diastology LVIDs:         2.04 cm  LV e' medial:    6.34 cm/s LV PW:         1.07 cm  LV E/e' medial:  14.7 LV IVS:         0.90  cm  LV e' lateral:   7.34 cm/s LVOT diam:     1.70 cm  LV E/e' lateral: 12.7 LV SV:         75 LV SV Index:   55 LVOT Area:     2.27 cm  RIGHT VENTRICLE             IVC RV Basal diam:  2.00 cm     IVC diam: 1.27 cm RV S prime:     12.70 cm/s TAPSE (M-mode): 1.6 cm LEFT ATRIUM         Index LA diam:    2.70 cm 1.99 cm/m  AORTIC VALVE LVOT Vmax:   172.00 cm/s LVOT Vmean:  119.000 cm/s LVOT VTI:    0.332 m AI PHT:      353 msec  AORTA Ao Root diam: 2.50 cm Ao Asc diam:  3.80 cm MITRAL VALVE                TRICUSPID VALVE MV Area (PHT): 3.42 cm     TR Peak grad:   21.5 mmHg MV Decel Time: 222 msec     TR Vmax:        232.00 cm/s MV E velocity: 93.00 cm/s MV A velocity: 103.00 cm/s  SHUNTS MV E/A ratio:  0.90         Systemic VTI:  0.33 m                             Systemic Diam: 1.70 cm Fransico Him MD Electronically signed by Fransico Him MD Signature Date/Time: 05/08/2020/1:13:31 PM    Final    DG HIP OPERATIVE UNILAT WITH PELVIS RIGHT  Result Date: 05/04/2020 CLINICAL DATA:  RIGHT hip hemiarthroplasty EXAM: OPERATIVE RIGHT HIP (WITH PELVIS IF PERFORMED) 2 VIEWS TECHNIQUE: Fluoroscopic spot image(s) were submitted for interpretation post-operatively. COMPARISON:  05/03/2020 FLUOROSCOPY TIME:  0 minutes 4 seconds Dose: 0.49 mGy FINDINGS: Interval resection of fractured RIGHT femoral neck fracture and femoral head. RIGHT femoral prosthesis in expected position. Bones demineralized. No additional fracture or dislocation. IMPRESSION: RIGHT hip hemiarthroplasty without acute abnormalities. Electronically Signed   By: Lavonia Dana M.D.   On: 05/04/2020 11:41   DG Hip Unilat With Pelvis 2-3 Views Right  Result Date: 05/03/2020 CLINICAL DATA:  Right hip pain after fall. EXAM: DG HIP (WITH OR WITHOUT PELVIS) 2-3V RIGHT COMPARISON:  None. FINDINGS: There is an acute impacted, subcapital right femoral neck fracture. No dislocation identified. Bilateral hip osteoarthritis. IMPRESSION: Acute, impacted, right  subcapital femoral neck fracture. Electronically Signed   By: Kerby Moors M.D.   On: 05/03/2020 11:55      Subjective: Patient sitting up in reclining chair and eating breakfast this morning.  Appears to be in good spirits.  Very pleasantly confused.  No real history obtainable from her.  She specifically denies pain.  Cannot tell me much about palpitations or dyspnea.  As per nursing, no acute issues noted.  Discharge Exam:  Vitals:   05/08/20 0503 05/08/20 0737 05/08/20 0856 05/08/20 1327  BP: (!) 142/77 (!) 177/97 (!) 143/85 (!) 155/89  Pulse: 81 96 79 96  Resp: 16  16 19   Temp: 98.1 F (36.7 C)  (!) 97.3 F (36.3 C) 99.1 F (37.3 C)  TempSrc:   Axillary Axillary  SpO2: 96%  95% 93%  Weight:      Height:        General exam: Elderly female,  small built and frail lying comfortably sitting up in chair without distress. Respiratory system: Clear to auscultation. Respiratory effort normal. Cardiovascular system: S1 & S2 heard, RRR. No JVD, murmurs, rubs, gallops or clicks. No pedal edema.    Telemetry shows sinus rhythm.  Had regular tachycardia/BBB morphology up to 140s-150s from approximately 4:30 PM to 7:30 PM on 3/30 then reverted to sinus rhythm.  Brief episode of tachycardia up to 170 this morning. Gastrointestinal system: Abdomen is nondistended, soft and nontender. No organomegaly or masses felt. Normal bowel sounds heard. Central nervous system: Alert and oriented only to self. No focal neurological deficits. Extremities: Symmetric 5 x 5 power.  Right hip surgical site dressing clean and dry.  Expected mild patchy bruising of right thigh. Skin: No rashes, lesions or ulcers Psychiatry: Judgement and insight impaired. Mood & affect pleasantly confused.     The results of significant diagnostics from this hospitalization (including imaging, microbiology, ancillary and laboratory) are listed below for reference.     Microbiology: Recent Results (from the past 240  hour(s))  Resp Panel by RT-PCR (Flu A&B, Covid) Nasopharyngeal Swab     Status: None   Collection Time: 05/03/20  1:05 PM   Specimen: Nasopharyngeal Swab; Nasopharyngeal(NP) swabs in vial transport medium  Result Value Ref Range Status   SARS Coronavirus 2 by RT PCR NEGATIVE NEGATIVE Final    Comment: (NOTE) SARS-CoV-2 target nucleic acids are NOT DETECTED.  The SARS-CoV-2 RNA is generally detectable in upper respiratory specimens during the acute phase of infection. The lowest concentration of SARS-CoV-2 viral copies this assay can detect is 138 copies/mL. A negative result does not preclude SARS-Cov-2 infection and should not be used as the sole basis for treatment or other patient management decisions. A negative result may occur with  improper specimen collection/handling, submission of specimen other than nasopharyngeal swab, presence of viral mutation(s) within the areas targeted by this assay, and inadequate number of viral copies(<138 copies/mL). A negative result must be combined with clinical observations, patient history, and epidemiological information. The expected result is Negative.  Fact Sheet for Patients:  EntrepreneurPulse.com.au  Fact Sheet for Healthcare Providers:  IncredibleEmployment.be  This test is no t yet approved or cleared by the [place] FDA and  has been authorized for detection and/or diagnosis of SARS-CoV-2 by FDA under an Emergency Use Authorization (EUA). This EUA will remain  in effect (meaning this test can be used) for the duration of the COVID-19 declaration under Section 564(b)(1) of the Act, 21 U.S.C.section 360bbb-3(b)(1), unless the authorization is terminated  or revoked sooner.       Influenza A by PCR NEGATIVE NEGATIVE Final   Influenza B by PCR NEGATIVE NEGATIVE Final    Comment: (NOTE) The Xpert Xpress SARS-CoV-2/FLU/RSV plus assay is intended as an aid in the diagnosis of influenza from  Nasopharyngeal swab specimens and should not be used as a sole basis for treatment. Nasal washings and aspirates are unacceptable for Xpert Xpress SARS-CoV-2/FLU/RSV testing.  Fact Sheet for Patients: EntrepreneurPulse.com.au  Fact Sheet for Healthcare Providers: IncredibleEmployment.be  This test is not yet approved or cleared by the [place] FDA and has been authorized for detection and/or diagnosis of SARS-CoV-2 by FDA under an Emergency Use Authorization (EUA). This EUA will remain in effect (meaning this test can be used) for the duration of the COVID-19 declaration under Section 564(b)(1) of the Act, 21 U.S.C. section 360bbb-3(b)(1), unless the authorization is terminated or revoked.  Performed at [facility], [address] [place], [place] [address]   Surgical PCR screen     Status: None   Collection Time: 05/03/20  6:07 PM   Specimen: Nasal Mucosa; Nasal Swab  Result Value Ref Range Status   MRSA, PCR NEGATIVE NEGATIVE Final   Staphylococcus aureus NEGATIVE NEGATIVE Final    Comment: (NOTE) The Xpert SA Assay (FDA approved for NASAL specimens in patients 75 years of age and older), is one component of a comprehensive surveillance program. It is not intended to diagnose infection nor to guide or monitor treatment. Performed at [facility], [place] [address], [place], [place] [address]   SARS CORONAVIRUS 2 (TAT 6-24 HRS) Nasopharyngeal Nasopharyngeal Swab     Status: None   Collection Time: 05/06/20  5:50 PM   Specimen: Nasopharyngeal Swab  Result Value Ref Range Status   SARS Coronavirus 2 NEGATIVE NEGATIVE Final    Comment: (NOTE) SARS-CoV-2 target nucleic acids are NOT DETECTED.  The SARS-CoV-2 RNA is generally detectable in upper and lower respiratory specimens during the acute phase of infection. Negative results do not preclude SARS-CoV-2 infection, do not rule out co-infections  with other pathogens, and should not be used as the sole basis for treatment or other patient management decisions. Negative results must be combined with clinical observations, patient history, and epidemiological information. The expected result is Negative.  Fact Sheet for Patients: SugarRoll.be  Fact Sheet for Healthcare Providers: https://www.woods-mathews.com/  This test is not yet approved or cleared by the [place] FDA and  has been authorized for detection and/or diagnosis of SARS-CoV-2 by FDA under an Emergency Use Authorization (EUA). This EUA will remain  in effect (meaning this test can be used) for the duration of the COVID-19 declaration under Se ction 564(b)(1) of the Act, 21 U.S.C. section 360bbb-3(b)(1), unless the authorization is terminated or revoked sooner.  Performed at [facility], [place] [address], [place], [place] [address]      Labs: CBC: Recent Labs  Lab 05/03/20 1255 05/04/20 0500 05/05/20 0426 05/06/20 0504 05/07/20 0546 05/08/20 0509  WBC 11.7* 8.9 6.6 6.4 5.4 5.4  NEUTROABS 10.6*  --   --   --   --   --   HGB 15.2* 14.1 12.0 11.4* 10.3* 9.6*  HCT 47.4* 43.6 38.1 37.3 32.3* 30.5*  MCV 87.6 86.5 89.0 90.8 89.2 88.2  PLT 187 158 120* 124* 129* 884    Basic Metabolic Panel: Recent Labs  Lab 05/03/20 1255 05/04/20 0500 05/05/20 0426 05/06/20 0504 05/08/20 0509  NA 139 141 139 139 139  K 4.2 4.1 4.0 3.5 4.3  CL 102 105 106 106 109  CO2 27 27 27 24 24   GLUCOSE 111* 122* 106* 102* 112*  BUN 22 24* 25* 22 13  CREATININE 0.85 0.81 1.01* 0.83 0.61  CALCIUM 9.3 9.3 8.3* 8.1* 7.6*  MG  --   --   --   --  2.0    Thyroid function studies Recent Labs    05/08/20 0509  TSH 4.161   Gust in detail with patient's brother via phone, updated care and answered questions.  He was appreciative of the call  Time coordinating discharge: 45 minutes  SIGNED:  Vernell Leep, MD, [place],  [facility]. [facility]  To contact the attending provider between 7A-7P or the covering provider during after hours 7P-7A, please log into the web site [email or web address] and access using universal [place] password for that web site. If you do not have the password,  please call the hospital operator.

## 2020-05-08 NOTE — TOC Transition Note (Signed)
Transition of Care [facility]) - CM/SW Discharge Note   Patient Details  Name: Amanda Randall MRN: [id number] Date of Birth: 1927/02/19  Transition of Care [facility]) CM/SW Contact:  Dessa Phi, RN Phone Number: 05/08/2020, 8:03 AM   Clinical Narrative: d/c to [address]-going to rm#[id number],nsg call report tel#[phone]. PTAR forms in printer.Will call PTAR once ready.      Final next level of care: Skilled Nursing Facility Barriers to Discharge: No Barriers Identified   Patient Goals and CMS Choice Patient states their goals for this hospitalization and ongoing recovery are:: go to rehab CMS Medicare.gov Compare Post Acute Care list provided to:: Patient Represenative (must comment) Milta Deiters brother [phone]) Choice offered to / list presented to : Adult Children  Discharge Placement                       Discharge Plan and Services   Discharge Planning Services: CM Consult Post Acute Care Choice: Skilled Nursing Facility                               Social Determinants of Health (SDOH) Interventions     Readmission Risk Interventions No flowsheet data found.

## 2020-05-08 NOTE — Consult Note (Signed)
Cardiology Consultation:   Patient ID: [name] MRN: [id number]; DOB: Aug 09, 1927  Admit date: 05/03/2020 Date of Consult: 05/08/2020  PCP:  Thurman Coyer, MD   [place]  Cardiologist:  No primary care provider on file. new Advanced Practice Provider:  No care team member to display Electrophysiologist:  None        Patient Profile:   Amanda Randall is a 87 y.o. female with a hx of advanced dementia, hx palpitations, iron def anemia, lt breast cancer with mastectomy, PAD, HTN, HLD  Admitted 05/03/20 with hip pain and fx who is being seen today for the evaluation of SVT at the request of Dr. Algis Liming.  History of Present Illness:   Amanda Randall 87 yr old female with hx as above and admit for Rt hip pain and findings of femoral neck fracture with surgery 05/04/20 of Rt hip hemiarthroplasty anterior approach.  She was progressing post op though with increase of confusion and some anemia and developed SVT in the 140s though did increase to 160s at one time.  She rec'd 2.5 mg metoprolol and her BP dropped into the 03K systolic.  IV fluids given. Cardiology gave recommendations per phone last evening.   She converted to SR around [place].  She had been on dilt drip as well and this was stopped.     In 2013 saw cardiology at [facility] for palpitations, monitor ordered but no results found.    EKG:  The EKG was personally reviewed and demonstrates:  SR with RBBB and 1st degree AV block, LVH no acute ST changes on admit.  Yesterday with SVT at 143, possible P wave retrograde to QRS.  Telemetry:  Telemetry was personally reviewed and demonstrates:  SR and SVT.   Na 139, K+ 4.3 Ca+ 7.6 mg+ 2.0 Cr 0.61  Hgb 9.6 was 12 on admit, plts 153 was 120 on admit,  TSH 4.161  PCXR on admit NAD, does have elevated Rt hemidiaphragm.     BP 142/77 to 177/97 P 70s to 80s temp 99.8 at 0100 today She is on lopressor 12.5 BID as outpt  Currently sitting up in chair attempting BK keeps  missing the bowl.  Unable to answer questions.   Past Medical History:  Diagnosis Date  . Breast cancer ([place])   . Dementia ([place])   . Hypertension   . Iron deficiency anemia   . Palpitations 2013    Past Surgical History:  Procedure Laterality Date  . ANTERIOR APPROACH HEMI HIP ARTHROPLASTY Right 05/04/2020   Procedure: ANTERIOR APPROACH HEMI HIP ARTHROPLASTY;  Surgeon: Rod Can, MD;  Location: [facility];  Service: Orthopedics;  Laterality: Right;  . mastectomy Left      Home Medications:  Prior to Admission medications   Medication Sig Start Date End Date Taking? Authorizing Provider  acetaminophen (TYLENOL) 325 MG tablet Take 650 mg by mouth every 6 (six) hours as needed for mild pain.   Yes [provider]  aspirin (ASPIRIN CHILDRENS) 81 MG chewable tablet Chew 1 tablet (81 mg total) by mouth 2 (two) times daily with a meal. 05/05/20 06/19/20 Yes McCauley, Casimer Leek, PA  citalopram (CELEXA) 20 MG tablet Take 20 mg by mouth daily.   Yes [provider]  ENSURE (ENSURE) Take 1 Can by mouth 2 (two) times daily between meals. VANILLA   Yes [provider]  memantine (NAMENDA) 10 MG tablet Take 10 mg by mouth 2 (two) times daily.   Yes [provider]  metoprolol tartrate (LOPRESSOR) 25 MG tablet Take 12.5 mg by mouth 2 (two) times daily. HOLD FOR SYSTOLIC BLOOD PRESSURE LESS THAN OR EQUAL TO 110   Yes [provider]  mirtazapine (REMERON) 15 MG tablet Take 15 mg by mouth at bedtime. 04/09/20  Yes [provider]  Multiple Vitamin (MULTIVITAMIN WITH MINERALS) TABS tablet Take 1 tablet by mouth daily.   Yes [provider]  polyethylene glycol (MIRALAX / GLYCOLAX) packet Take 17 g by mouth daily.   Yes [provider]  rivastigmine (EXELON) 3 MG capsule Take 3 mg by mouth 2 (two) times daily.   Yes [provider]  vitamin B-12 (CYANOCOBALAMIN) 250 MCG tablet Take 250 mcg by mouth daily.   Yes [provider]  HYDROcodone-acetaminophen (NORCO/VICODIN) 5-325 MG tablet Take 1 tablet by mouth every 6 (six) hours as needed for up to 7 days for moderate pain. 05/05/20 05/12/20  Dorothyann Peng, PA    Inpatient Medications: Scheduled Meds: . citalopram  20 mg Oral Daily  . docusate sodium  100 mg Oral BID  . enoxaparin (LOVENOX) injection  30 mg Subcutaneous Q24H  . feeding supplement  237 mL Oral BID BM  . memantine  10 mg Oral BID  . metoprolol tartrate  12.5 mg Oral BID  . mirtazapine  7.5 mg Oral QHS  . multivitamin with minerals  1 tablet Oral Daily  . polyethylene glycol  17 g Oral Daily  . rivastigmine  3 mg Oral BID   Continuous Infusions: . sodium chloride 50 mL/hr at 05/08/20 0318  . methocarbamol (ROBAXIN) IV     PRN Meds: acetaminophen, HYDROcodone-acetaminophen, HYDROmorphone (DILAUDID) injection, menthol-cetylpyridinium **OR** phenol, methocarbamol **OR** methocarbamol (ROBAXIN) IV, metoCLOPramide **OR** metoCLOPramide (REGLAN) injection, ondansetron **OR** ondansetron (ZOFRAN) IV  Allergies:   No Known Allergies  Social History:   Social History   Socioeconomic History  . Marital status: Unknown    Spouse name: Not on file  . Number of children: Not on file  . Years of education: Not on file  . Highest education level: Not on file  Occupational History  . Not on file  Tobacco Use  . Smoking status: Never Smoker  . Smokeless tobacco: Never Used  Vaping Use  . Vaping Use: Never used  Substance and Sexual Activity  . Alcohol use: No  . Drug use: No  . Sexual activity: Not on file  Other Topics Concern  . Not on file  Social History Narrative  . Not on file   Social Determinants of Health   Financial Resource Strain: Not on file  Food Insecurity: Not on file  Transportation Needs: Not on file  Physical Activity: Not on file  Stress: Not on file  Social Connections: Not on file  Intimate Partner Violence: Not on file    Family History:     Family History  Problem Relation Age of Onset  . Cancer Mother   . Heart attack Father   . Diabetes Father      ROS:  Please see the history of present illness.  General:no colds or fevers, no weight changes Skin:no rashes or ulcers HEENT:no blurred vision, no congestion CV:see HPI PUL:see HPI GI:no diarrhea constipation or melena, no indigestion GU:no hematuria, no dysuria MS:no joint pain, no claudication Neuro:no syncope, no lightheadedness Endo:no diabetes, no thyroid disease  All other ROS reviewed and negative.     Physical Exam/Data:   Vitals:   05/07/20 2055 05/08/20 0112 05/08/20 0503  05/08/20 0737  BP: 118/70 140/75 (!) 142/77 (!) 177/97  Pulse: 99 84 81 96  Resp: 16 18 16    Temp: 98.3 F (36.8 C) 99.8 F (37.7 C) 98.1 F (36.7 C)   TempSrc:  Oral    SpO2: 97% 100% 96%   Weight:      Height:        Intake/Output Summary (Last 24 hours) at 05/08/2020 0813 Last data filed at 05/08/2020 0318 Gross per 24 hour  Intake 2728.98 ml  Output 400 ml  Net 2328.98 ml   Last 3 Weights 05/03/2020 02/18/2020 03/26/2017  Weight (lbs) 92 lb 120 lb 120 lb  Weight (kg) 41.731 kg 54.432 kg 54.432 kg     Body mass index is 17.38 kg/m.  General:  Frail, in no acute distress HEENT: normal Lymph: no adenopathy Neck: no JVD sitting up in chair Endocrine:  No thryomegaly Vascular: No carotid bruits; pedal pulses 1+ bilaterally   Cardiac:  normal S1, S2; RRR; no murmur gallup rub or click Lungs:  clear to auscultation bilaterally, no wheezing, rhonchi or rales  Abd: soft, nontender, no hepatomegaly  Ext: no edema, cannot see incision with sitting up in chair Musculoskeletal:  No deformities, BUE and BLE strength normal  Skin: warm and dry  Neuro: does not answer questions but pleasant affect, alert,  Psych:  Normal affect   Relevant CV Studies: Echo ordered  Laboratory Data:  High Sensitivity Troponin:  No results for input(s): TROPONINIHS in the last 720 hours.    Chemistry Recent Labs  Lab 05/05/20 0426 05/06/20 0504 05/08/20 0509  NA 139 139 139  K 4.0 3.5 4.3  CL 106 106 109  CO2 27 24 24   GLUCOSE 106* 102* 112*  BUN 25* 22 13  CREATININE 1.01* 0.83 0.61  CALCIUM 8.3* 8.1* 7.6*  GFRNONAA 53* >60 >60  ANIONGAP 6 9 6     No results for input(s): PROT, ALBUMIN, AST, ALT, ALKPHOS, BILITOT in the last 168 hours. Hematology Recent Labs  Lab 05/06/20 0504 05/07/20 0546 05/08/20 0509  WBC 6.4 5.4 5.4  RBC 4.11 3.62* 3.46*  HGB 11.4* 10.3* 9.6*  HCT 37.3 32.3* 30.5*  MCV 90.8 89.2 88.2  MCH 27.7 28.5 27.7  MCHC 30.6 31.9 31.5  RDW 13.3 13.2 13.2  PLT 124* 129* 153   BNPNo results for input(s): BNP, PROBNP in the last 168 hours.  DDimer No results for input(s): DDIMER in the last 168 hours.   Radiology/Studies:  Pelvis Portable  Result Date: 05/04/2020 CLINICAL DATA:  Post RIGHT hip hemiarthroplasty EXAM: PORTABLE PELVIS 1-2 VIEWS COMPARISON:  Portable exam 1029 hours compared intraoperative images of 05/04/2020 FINDINGS: Osseous demineralization. RIGHT hip prosthesis identified without fracture or dislocation. Extensive atherosclerotic calcifications. Postsurgical changes of the soft tissues at the RIGHT hip region. IMPRESSION: RIGHT hip prosthesis without acute abnormalities. Electronically Signed   By: Lavonia Dana M.D.   On: 05/04/2020 12:51   DG C-Arm 1-60 Min-No Report  Result Date: 05/04/2020 Fluoroscopy was utilized by the requesting physician.  No radiographic interpretation.   DG HIP OPERATIVE UNILAT WITH PELVIS RIGHT  Result Date: 05/04/2020 CLINICAL DATA:  RIGHT hip hemiarthroplasty EXAM: OPERATIVE RIGHT HIP (WITH PELVIS IF PERFORMED) 2 VIEWS TECHNIQUE: Fluoroscopic spot image(s) were submitted for interpretation post-operatively. COMPARISON:  05/03/2020 FLUOROSCOPY TIME:  0 minutes 4 seconds Dose: 0.49 mGy FINDINGS: Interval resection of fractured RIGHT femoral neck fracture and femoral head. RIGHT femoral prosthesis in  expected position. Bones demineralized. No additional fracture or dislocation.  IMPRESSION: RIGHT hip hemiarthroplasty without acute abnormalities. Electronically Signed   By: Lavonia Dana M.D.   On: 05/04/2020 11:41     Assessment and Plan:   1. SVT it is regular and may have retrograde P wave vs MF atrial tach.. Remote hx of palpitations in 2013.  No acute EKG changes. Will increase BB dose to 25 BID and she will need outpt monitor.  She is going to facility  2. fx femoral neck on Rt with rt hip hemiarthroplasty 3/27.  3. HTN elevated today did drop with IV BB 4. Dementia lives at [place]. Post op anemia, with hx iron def anemia/thrombocytopenia post op 6. In ER respiratory failure resolved may have been due to fentanyl.    Risk Assessment/Risk Scores:                For questions or updates, please contact [place] Please consult [email or web address] for contact info under    Signed, Cecilie Kicks, NP  05/08/2020 8:13 AM

## 2020-05-08 NOTE — Progress Notes (Signed)
Occupational Therapy Treatment Patient Details Name: Amanda Randall MRN: [id number] DOB: 07-01-1927 Today's Date: 05/08/2020    History of present illness Pt is a 87 y.o. female from [place] admitted 05/03/20 for Right hip hemiarthroplasty, direct anterior approach after a fall.  PMH includes: advanced dementia, hypertension.   OT comments  Patient more restless this morning, RN reports patient becomes like this when she needs to use the bathroom. Patient mod A x2 for safety to pivot to bedside commode and cues for safety. Patient pulling at gown, needing cues to redirect. Ultimately did not void, total A for changing into clean gown and mesh underwear. Mod A x2 for safety as patient freezing mid pivot to recliner. Mitts donned and chair alarm set at end of session.    Follow Up Recommendations  SNF    Equipment Recommendations  None recommended by OT       Precautions / Restrictions Precautions Precautions: Fall Precaution Comments: dementia Restrictions RLE Weight Bearing: Weight bearing as tolerated       Mobility Bed Mobility Overal bed mobility: Needs Assistance Bed Mobility: Supine to Sit     Supine to sit: Supervision;HOB elevated     General bed mobility comments: patient attempting to get out of the bed before rail down, restless    Transfers Overall transfer level: Needs assistance Equipment used: 2 person hand held assist Transfers: Sit to/from [employer] Transfers Sit to Stand: Mod assist;+2 safety/equipment Stand pivot transfers: Mod assist;+2 safety/equipment       General transfer comment: patient restless and anxious, transferred first to bedside commode but did not void then to recliner. patient freezing mid transfer needing tactile cues and assist to pivot hips    Balance Overall balance assessment: Needs assistance Sitting-balance support: Feet supported Sitting balance-Leahy Scale: Fair     Standing balance support: Single  extremity supported;Bilateral upper extremity supported Standing balance-Leahy Scale: Poor Standing balance comment: reliant on external assistance                           ADL either performed or assessed with clinical judgement   ADL Overall ADL's : Needs assistance/impaired                         Toilet Transfer: Moderate assistance;+2 for safety/equipment;Stand-pivot;Cueing for safety;BSC Toilet Transfer Details (indicate cue type and reason): patient not easily redirected, needing cues for safety and mod A x2 for safety to pivot hips as patient freezes mid transfer [place] and Hygiene: Total assistance Toileting - Clothing Manipulation Details (indicate cue type and reason): for mesh underwear and peri care as patient is unsteady in standing and history of dementia     Functional mobility during ADLs: Moderate assistance;+2 for safety/equipment;Cueing for safety;Cueing for sequencing                 Cognition Arousal/Alertness: Awake/alert Behavior During Therapy: Anxious;Restless Overall Cognitive Status: History of cognitive impairments - at baseline                                 General Comments: per nursing patient becomes more anxious and restless when she needs to use bathroom                   Pertinent Vitals/ Pain       Pain Assessment: Faces Faces Pain  Scale: Hurts a little bit Pain Location: generalized Pain Descriptors / Indicators: Discomfort;Grimacing Pain Intervention(s): Limited activity within patient's tolerance         Frequency  Min 2X/week        Progress Toward Goals  OT Goals(current goals can now be found in the care plan section)  Progress towards OT goals: Not progressing toward goals - comment (cognition)  Acute Rehab OT Goals Patient Stated Goal: "im cold" OT Goal Formulation: With patient Time For Goal Achievement: 05/19/20 Potential to Achieve Goals:  Good ADL Goals Pt Will Perform Grooming: with set-up;sitting Pt Will Perform Upper Body Dressing: with set-up;sitting Pt Will Perform Lower Body Dressing: with min assist;sit to/from stand Pt Will Transfer to Toilet: with supervision;ambulating Pt Will Perform Toileting - Clothing Manipulation and hygiene: with min guard assist;sit to/from stand  Plan Discharge plan remains appropriate       AM-PAC OT "6 Clicks" Daily Activity     Outcome Measure   Help from another person eating meals?: Total (mits) Help from another person taking care of personal grooming?: A Little Help from another person toileting, which includes using toliet, bedpan, or urinal?: A Lot Help from another person bathing (including washing, rinsing, drying)?: A Lot Help from another person to put on and taking off regular upper body clothing?: A Lot Help from another person to put on and taking off regular lower body clothing?: Total 6 Click Score: 11    End of Session  OT Visit Diagnosis: Unsteadiness on feet (R26.81);Other abnormalities of gait and mobility (R26.89);History of falling (Z91.81);Pain Pain - Right/Left: Right Pain - part of body: Hip   Activity Tolerance Patient tolerated treatment well   Patient Left in chair;with call bell/phone within reach;with chair alarm set;Other (comment) (mits donned)   Nurse Communication Other (comment) (RN present for session)        Time: [phone] OT Time Calculation (min): 25 min  Charges: OT General Charges $OT Visit: 1 Visit OT Treatments $Self Care/Home Management : 23-37 mins  Delbert Phenix OT OT pager: [phone]   Rosemary Holms 05/08/2020, 8:22 AM

## 2020-05-08 NOTE — TOC Progression Note (Addendum)
Transition of Care [facility]) - Progression Note    Patient Details  Name: Amanda Randall MRN: [id number] Date of Birth: 21-Jun-1927  [facility]) CM/SW Contact  Addylynn Balin, Juliann Pulse, RN Phone Number: 05/08/2020, 8:04 AM  Clinical Narrative:  Received Josem Kaufmann 3/30-4/1 for SNF @ [place] can accept once stable for d/c. covid neg on 3/29.  1p-d/c summary sent to [facility]-noted awaiting echo results. Patient can d/c to [facility] [address] once all treatment & services completed. Await rm#,# tel for nsg report.PTAR forms in printer. 1:23p-PTAR called. No further CM needs. 1:51p-updated d/c summary sent to [address].Going to rm#[id number],tel# report [phone]. Awaiting PTAR for transport to [address].    Expected Discharge Plan: [place] Barriers to Discharge: Continued Medical Work up  Expected Discharge Plan and Services Expected Discharge Plan: [place]   Discharge Planning Services: CM Consult Post Acute Care Choice: [place] Living arrangements for the past 2 months: [place]                                       Social Determinants of Health (SDOH) Interventions    Readmission Risk Interventions No flowsheet data found.

## 2020-05-08 NOTE — Progress Notes (Signed)
Gave updated report to Benjamine Mola, Therapist, sports at [place] at [facility]. Left number if she had additional questions.

## 2020-05-08 NOTE — Discharge Instructions (Signed)
Dr. Rod Can Joint Replacement Specialist [facility] [address] [place], [place] [address] [phone]   TOTAL HIP REPLACEMENT POSTOPERATIVE DIRECTIONS    Hip Rehabilitation, Guidelines Following Surgery   WEIGHT BEARING Weight bearing as tolerated with assist device (walker, cane, etc) as directed, use it as long as suggested by your surgeon or therapist, typically at least 4-6 weeks.  The results of a hip operation are greatly improved after range of motion and muscle strengthening exercises. Follow all safety measures which are given to protect your hip. If any of these exercises cause increased pain or swelling in your joint, decrease the amount until you are comfortable again. Then slowly increase the exercises. Call your caregiver if you have problems or questions.   HOME CARE INSTRUCTIONS  Most of the following instructions are designed to prevent the dislocation of your new hip.  Remove items at home which could result in a fall. This includes throw rugs or furniture in walking pathways.  Continue medications as instructed at time of discharge.  You may have some home medications which will be placed on hold until you complete the course of blood thinner medication.  You may start showering once you are discharged home. Do not remove your dressing. Do not put on socks or shoes without following the instructions of your caregivers.   Sit on chairs with arms. Use the chair arms to help push yourself up when arising.  Arrange for the use of a toilet seat elevator so you are not sitting low.   Walk with walker as instructed.  You may resume a sexual relationship in one month or when given the OK by your caregiver.  Use walker as long as suggested by your caregivers.  You may put full weight on your legs and walk as much as is comfortable. Avoid periods of inactivity such as sitting longer than an hour when not asleep. This helps prevent  blood clots.  You may return to work once you are cleared by Engineer, production.  Do not drive a car for 6 weeks or until released by your surgeon.  Do not drive while taking narcotics.  Wear elastic stockings for two weeks following surgery during the day but you may remove then at night.  Make sure you keep all of your appointments after your operation with all of your doctors and caregivers. You should call the office at the above phone number and make an appointment for approximately two weeks after the date of your surgery. Please pick up a stool softener and laxative for home use as long as you are requiring pain medications.  ICE to the affected hip every three hours for 30 minutes at a time and then as needed for pain and swelling. Continue to use ice on the hip for pain and swelling from surgery. You may notice swelling that will progress down to the foot and ankle.  This is normal after surgery.  Elevate the leg when you are not up walking on it.   It is important for you to complete the blood thinner medication as prescribed by your doctor.  Continue to use the breathing machine which will help keep your temperature down.  It is common for your temperature to cycle up and down following surgery, especially at night when you are not up moving around and exerting yourself.  The breathing machine keeps your lungs expanded and your temperature down.  RANGE OF MOTION AND STRENGTHENING EXERCISES  These exercises  are designed to help you keep full movement of your hip joint. Follow your caregiver's or physical therapist's instructions. Perform all exercises about fifteen times, three times per day or as directed. Exercise both hips, even if you have had only one joint replacement. These exercises can be done on a training (exercise) mat, on the floor, on a table or on a bed. Use whatever works the best and is most comfortable for you. Use music or television while you are exercising so that the exercises  are a pleasant break in your day. This will make your life better with the exercises acting as a break in routine you can look forward to.  Lying on your back, slowly slide your foot toward your buttocks, raising your knee up off the floor. Then slowly slide your foot back down until your leg is straight again.  Lying on your back spread your legs as far apart as you can without causing discomfort.  Lying on your side, raise your upper leg and foot straight up from the floor as far as is comfortable. Slowly lower the leg and repeat.  Lying on your back, tighten up the muscle in the front of your thigh (quadriceps muscles). You can do this by keeping your leg straight and trying to raise your heel off the floor. This helps strengthen the largest muscle supporting your knee.  Lying on your back, tighten up the muscles of your buttocks both with the legs straight and with the knee bent at a comfortable angle while keeping your heel on the floor.   SKILLED REHAB INSTRUCTIONS: If the patient is transferred to a skilled rehab facility following release from the hospital, a list of the current medications will be sent to the facility for the patient to continue.  When discharged from the skilled rehab facility, please have the facility set up the patient's [place] prior to being released. Also, the skilled facility will be responsible for providing the patient with their medications at time of release from the facility to include their pain medication and their blood thinner medication. If the patient is still at the rehab facility at time of the two week follow up appointment, the skilled rehab facility will also need to assist the patient in arranging follow up appointment in our office and any transportation needs.  MAKE SURE YOU:  Understand these instructions.  Will watch your condition.  Will get help right away if you are not doing well or get worse.  Pick up stool softner and  laxative for home use following surgery while on pain medications. Do not remove your dressing. The dressing is waterproof--it is OK to take showers. Continue to use ice for pain and swelling after surgery. Do not use any lotions or creams on the incision until instructed by your surgeon. Total Hip Protocol.   Additional discharge instructions  Please get your medications reviewed and adjusted by your Primary MD.  Please request your Primary MD to go over all Hospital Tests and Procedure/Radiological results at the follow up, please get all Hospital records sent to your Prim MD by signing hospital release before you go home.  If you had Pneumonia of Lung problems at the Hospital: Please get a 2 view Chest X ray done in 6-8 weeks after hospital discharge or sooner if instructed by your Primary MD.  If you have Congestive Heart Failure: Please call your Cardiologist or Primary MD anytime you have any of the following symptoms:  1)  3 pound weight gain in 24 hours or 5 pounds in 1 week  2) shortness of breath, with or without a dry hacking cough  3) swelling in the hands, feet or stomach  4) if you have to sleep on extra pillows at night in order to breathe  Follow cardiac low salt diet and 1.5 lit/day fluid restriction.  If you have diabetes Accuchecks 4 times/day, Once in AM empty stomach and then before each meal. Log in all results and show them to your primary doctor at your next visit. If any glucose reading is under 80 or above 300 call your primary MD immediately.  If you have Seizure/Convulsions/Epilepsy: Please do not drive, operate heavy machinery, participate in activities at heights or participate in high speed sports until you have seen by Primary MD or a Neurologist and advised to do so again.  If you had Gastrointestinal Bleeding: Please ask your Primary MD to check a complete blood count within one week of discharge or at your next visit. Your endoscopic/colonoscopic  biopsies that are pending at the time of discharge, will also need to followed by your Primary MD.  Get Medicines reviewed and adjusted. Please take all your medications with you for your next visit with your Primary MD  Please request your Primary MD to go over all hospital tests and procedure/radiological results at the follow up, please ask your Primary MD to get all Hospital records sent to his/her office.  If you experience worsening of your admission symptoms, develop shortness of breath, life threatening emergency, suicidal or homicidal thoughts you must seek medical attention immediately by calling 911 or calling your MD immediately  if symptoms less severe.  You must read complete instructions/literature along with all the possible adverse reactions/side effects for all the Medicines you take and that have been prescribed to you. Take any new Medicines after you have completely understood and accpet all the possible adverse reactions/side effects.   Do not drive or operate heavy machinery when taking Pain medications.   Do not take more than prescribed Pain, Sleep and Anxiety Medications  Special Instructions: If you have smoked or chewed Tobacco  in the last 2 yrs please stop smoking, stop any regular Alcohol  and or any Recreational drug use.  Wear Seat belts while driving.  Please note You were cared for by a hospitalist during your hospital stay. If you have any questions about your discharge medications or the care you received while you were in the hospital after you are discharged, you can call the unit and asked to speak with the hospitalist on call if the hospitalist that took care of you is not available. Once you are discharged, your primary care physician will handle any further medical issues. Please note that NO REFILLS for any discharge medications will be authorized once you are discharged, as it is imperative that you return to your primary care physician (or establish a  relationship with a primary care physician if you do not have one) for your aftercare needs so that they can reassess your need for medications and monitor your lab values.  You can reach the hospitalist office at phone [phone] or fax [phone]   If you do not have a primary care physician, you can call [phone] for a physician referral.

## 2020-05-08 NOTE — Progress Notes (Signed)
Patient is back in SR and HR in the 80s. Will d/c cardizem drip per Dr. Myna Hidalgo. Will continue to monitor.

## 2020-05-08 NOTE — Plan of Care (Signed)

## 2020-05-08 NOTE — Progress Notes (Signed)
  Echocardiogram 2D Echocardiogram has been performed.  Kareen Jefferys G Zyon Grout 05/08/2020, 11:04 AM

## 2020-05-21 ENCOUNTER — Ambulatory Visit (HOSPITAL_COMMUNITY): Payer: Medicare Other | Admitting: Nurse Practitioner

## 2020-05-27 NOTE — Progress Notes (Signed)
Primary Care Physician: Thurman Coyer, MD Primary Cardiologist: Dr Radford Pax Primary Electrophysiologist: none Referring Physician: Dr Troy Sine is a 87 y.o. female with a history of advanced dementia, iron def anemia, breast cancer with mastectomy, PAD, HTN, HLD, and SVT/atrial tach who presents for consultation in the [facility]. Patient has a CHADS2VASC score of 5. She was hospitalized for a broken hip 05/03/20 and was noted to have paroxysmal heart racing on telemetry. Per report, telemetry was consistent with atrial tachycardia although atrial flutter could not be excluded. She was discharged on metoprolol. History is provided by brother who accompanies her today. She has done well since leaving the hospital. She has no awareness of any heart racing. Her family does report she is prone to falling.   Review of systems unable to be obtained due to advanced dementia.    Atrial Fibrillation Risk Factors:  she does not have symptoms or diagnosis of sleep apnea. she does not have a history of rheumatic fever.   she has a BMI of Body mass index is 18.55 kg/m.Marland Kitchen Filed Weights   05/28/20 1056  Weight: 44.5 kg    Family History  Problem Relation Age of Onset  . Cancer Mother   . Heart attack Father   . Diabetes Father      Atrial Fibrillation Management history:  Previous antiarrhythmic drugs: none Previous cardioversions: none Previous ablations: none CHADS2VASC score: 5 Anticoagulation history: none   Past Medical History:  Diagnosis Date  . Breast cancer ([place])   . Dementia ([place])   . Hypertension   . Iron deficiency anemia   . Palpitations 2013   Past Surgical History:  Procedure Laterality Date  . ANTERIOR APPROACH HEMI HIP ARTHROPLASTY Right 05/04/2020   Procedure: ANTERIOR APPROACH HEMI HIP ARTHROPLASTY;  Surgeon: Rod Can, MD;  Location: [facility] ORS;  Service: Orthopedics;  Laterality: Right;  . mastectomy Left      Current Outpatient Medications  Medication Sig Dispense Refill  . acetaminophen (TYLENOL) 325 MG tablet Take 650 mg by mouth every 6 (six) hours as needed for mild pain.    Marland Kitchen aspirin (ASPIRIN CHILDRENS) 81 MG chewable tablet Chew 1 tablet (81 mg total) by mouth 2 (two) times daily with a meal. 90 tablet 0  . citalopram (CELEXA) 20 MG tablet Take 20 mg by mouth daily.    Marland Kitchen ENSURE (ENSURE) Take 1 Can by mouth 2 (two) times daily between meals. VANILLA    . ferrous gluconate (FERGON) 324 MG tablet Take 324 mg by mouth every Monday, Wednesday, and Friday.    . memantine (NAMENDA) 10 MG tablet Take 10 mg by mouth 2 (two) times daily.    . metoprolol tartrate (LOPRESSOR) 25 MG tablet Take 1 tablet (25 mg total) by mouth 2 (two) times daily. HOLD FOR SYSTOLIC BLOOD PRESSURE LESS THAN 100    . mirtazapine (REMERON) 15 MG tablet Take 15 mg by mouth at bedtime.    . mirtazapine (REMERON) 7.5 MG tablet Take 7.5 mg by mouth at bedtime.    . Multiple Vitamin (MULTIVITAMIN WITH MINERALS) TABS tablet Take 1 tablet by mouth daily.    . polyethylene glycol (MIRALAX / GLYCOLAX) packet Take 17 g by mouth daily.    . rivastigmine (EXELON) 3 MG capsule Take 3 mg by mouth 2 (two) times daily.    . vitamin B-12 (CYANOCOBALAMIN) 250 MCG tablet Take 250 mcg by mouth daily.     No current facility-administered  medications for this encounter.    No Known Allergies  Social History   Socioeconomic History  . Marital status: Unknown    Spouse name: Not on file  . Number of children: Not on file  . Years of education: Not on file  . Highest education level: Not on file  Occupational History  . Not on file  Tobacco Use  . Smoking status: Never Smoker  . Smokeless tobacco: Never Used  Vaping Use  . Vaping Use: Never used  Substance and Sexual Activity  . Alcohol use: No  . Drug use: No  . Sexual activity: Not on file  Other Topics Concern  . Not on file  Social History Narrative  . Not on file    Social Determinants of Health   Financial Resource Strain: Not on file  Food Insecurity: Not on file  Transportation Needs: Not on file  Physical Activity: Not on file  Stress: Not on file  Social Connections: Not on file  Intimate Partner Violence: Not on file     ROS- Unable to be obtained as above.  Physical Exam: Vitals:   05/28/20 1056  BP: 122/60  Pulse: 62  Weight: 44.5 kg  Height: 5\' 1"  (1.549 m)    GEN- The patient is a well appearing elderly female, alert but not oriented today.  Head- normocephalic, atraumatic Eyes-  Sclera clear, conjunctiva pink Ears- hearing intact Oropharynx- clear Neck- supple  Lungs- Clear to ausculation bilaterally, normal work of breathing Heart- Regular rate and rhythm, no murmurs, rubs or gallops  GI- soft, NT, ND, + BS Extremities- no clubbing, cyanosis, or edema MS- no significant deformity or atrophy Skin- no rash or lesion Psych- euthymic mood, full affect Neuro- strength and sensation are intact  Wt Readings from Last 3 Encounters:  05/28/20 44.5 kg  05/03/20 41.7 kg  02/18/20 54.4 kg    EKG today demonstrates  SR Vent. rate 62 BPM PR interval 184 ms QRS duration 110 ms QT/QTcB 434/440 ms  Echo 05/08/20 demonstrated  1. Left ventricular ejection fraction, by estimation, is 65 to 70%. The  left ventricle has normal function. The left ventricle has no regional  wall motion abnormalities. Left ventricular diastolic parameters are  consistent with Grade II diastolic  dysfunction (pseudonormalization). Elevated left ventricular end-diastolic  pressure.  2. Right ventricular systolic function is normal. The right ventricular  size is normal. There is normal pulmonary artery systolic pressure. The  estimated right ventricular systolic pressure is 24.4 mmHg.  3. Left atrial size was mildly dilated.  4. The mitral valve is normal in structure. No evidence of mitral valve  regurgitation. No evidence of mitral  stenosis.  5. The aortic valve is tricuspid. Aortic valve regurgitation is moderate.  Mild to moderate aortic valve sclerosis/calcification is present, without  any evidence of aortic stenosis. Aortic regurgitation PHT measures 353  msec.  6. There is mild dilatation of the ascending aorta, measuring 38 mm.  7. The inferior vena cava is normal in size with <50% respiratory  variability, suggesting right atrial pressure of 8 mmHg.   Epic records are reviewed at length today  CHA2DS2-VASc Score = 5  The patient's score is based upon: CHF History: No HTN History: Yes Diabetes History: No Stroke History: No Vascular Disease History: Yes Age Score: 2 Gender Score: 1      ASSESSMENT AND PLAN: 1. SVT/atrial tachycardia The patient's CHA2DS2-VASc score is 5, indicating a 7.2% annual risk of stroke.   Patient in [place]  today. No symptoms of tachypalpitations.  Continue Lopressor 25 mg BID Would not recommend anticoagulation at this point given her advanced dementia, propensity to fall, and no clear diagnosis of afib/flutter.   2. HTN Stable, no changes today.   Follow up with Dr Radford Pax in 3 months. AF clinic as needed.    [facility] [address] [place], [place] [address] [phone] 05/28/2020 11:01 AM

## 2020-05-28 ENCOUNTER — Other Ambulatory Visit: Payer: Self-pay

## 2020-05-28 ENCOUNTER — Encounter (HOSPITAL_COMMUNITY): Payer: Self-pay | Admitting: Physician Assistant

## 2020-05-28 ENCOUNTER — Ambulatory Visit (HOSPITAL_COMMUNITY)
Admission: RE | Admit: 2020-05-28 | Discharge: 2020-05-28 | Disposition: A | Discharge status: Home / Self Care | Payer: Medicare Other | Source: Ambulatory Visit | Attending: Nurse Practitioner | Admitting: Nurse Practitioner

## 2020-05-28 VITALS — BP 122/60 | HR 62 | Ht 61.0 in | Wt 98.2 lb

## 2020-05-28 DIAGNOSIS — Z9010 Acquired absence of unspecified breast and nipple: Secondary | ICD-10-CM | POA: Insufficient documentation

## 2020-05-28 DIAGNOSIS — I10 Essential (primary) hypertension: Secondary | ICD-10-CM | POA: Diagnosis not present

## 2020-05-28 DIAGNOSIS — Z853 Personal history of malignant neoplasm of breast: Secondary | ICD-10-CM | POA: Diagnosis not present

## 2020-05-28 DIAGNOSIS — Z7982 Long term (current) use of aspirin: Secondary | ICD-10-CM | POA: Diagnosis not present

## 2020-05-28 DIAGNOSIS — Z79899 Other long term (current) drug therapy: Secondary | ICD-10-CM | POA: Insufficient documentation

## 2020-05-28 DIAGNOSIS — I739 Peripheral vascular disease, unspecified: Secondary | ICD-10-CM | POA: Diagnosis not present

## 2020-05-28 DIAGNOSIS — Z9181 History of falling: Secondary | ICD-10-CM | POA: Insufficient documentation

## 2020-05-28 DIAGNOSIS — I471 Supraventricular tachycardia: Secondary | ICD-10-CM | POA: Insufficient documentation

## 2020-05-28 DIAGNOSIS — I4510 Unspecified right bundle-branch block: Secondary | ICD-10-CM | POA: Diagnosis present

## 2020-05-28 DIAGNOSIS — E785 Hyperlipidemia, unspecified: Secondary | ICD-10-CM | POA: Insufficient documentation

## 2020-05-28 DIAGNOSIS — F0390 Unspecified dementia without behavioral disturbance: Secondary | ICD-10-CM | POA: Insufficient documentation

## 2020-05-28 DIAGNOSIS — D509 Iron deficiency anemia, unspecified: Secondary | ICD-10-CM | POA: Insufficient documentation

## 2020-05-28 MED ORDER — METOPROLOL TARTRATE 25 MG PO TABS: 25.0000 mg | ORAL_TABLET | Freq: Two times a day (BID) | ORAL | 3 refills | Status: AC

## 2020-08-28 ENCOUNTER — Ambulatory Visit: Payer: Medicare Other | Admitting: Cardiology

## 2020-11-25 ENCOUNTER — Ambulatory Visit: Payer: Medicare Other | Admitting: Cardiology

## 2021-02-22 ENCOUNTER — Other Ambulatory Visit: Payer: Self-pay

## 2021-02-22 ENCOUNTER — Emergency Department (HOSPITAL_COMMUNITY)
Admission: EM | Admit: 2021-02-22 | Discharge: 2021-02-22 | Disposition: A | Discharge status: Home / Self Care | Payer: Medicare Other | Attending: Emergency Medicine | Admitting: Emergency Medicine

## 2021-02-22 ENCOUNTER — Emergency Department (HOSPITAL_COMMUNITY): Payer: Medicare Other

## 2021-02-22 DIAGNOSIS — F0390 Unspecified dementia without behavioral disturbance: Secondary | ICD-10-CM | POA: Diagnosis not present

## 2021-02-22 DIAGNOSIS — W1839XA Other fall on same level, initial encounter: Secondary | ICD-10-CM | POA: Diagnosis not present

## 2021-02-22 DIAGNOSIS — S0181XA Laceration without foreign body of other part of head, initial encounter: Secondary | ICD-10-CM | POA: Diagnosis not present

## 2021-02-22 DIAGNOSIS — S0990XA Unspecified injury of head, initial encounter: Secondary | ICD-10-CM

## 2021-02-22 DIAGNOSIS — I10 Essential (primary) hypertension: Secondary | ICD-10-CM | POA: Insufficient documentation

## 2021-02-22 DIAGNOSIS — Z853 Personal history of malignant neoplasm of breast: Secondary | ICD-10-CM | POA: Diagnosis not present

## 2021-02-22 DIAGNOSIS — S0191XA Laceration without foreign body of unspecified part of head, initial encounter: Secondary | ICD-10-CM

## 2021-02-22 LAB — BASIC METABOLIC PANEL
Anion gap: 7 (ref 5–15)
BUN: 26 mg/dL — ABNORMAL HIGH (ref 8–23)
CO2: 30 mmol/L (ref 22–32)
Calcium: 9.5 mg/dL (ref 8.9–10.3)
Chloride: 104 mmol/L (ref 98–111)
Creatinine, Ser: 0.59 mg/dL (ref 0.44–1.00)
GFR, Estimated: >60 mL/min (ref >60)
Glucose, Bld: 107 mg/dL — ABNORMAL HIGH (ref 70–99)
Potassium: 4.3 mmol/L (ref 3.5–5.1)
Sodium: 141 mmol/L (ref 135–145)

## 2021-02-22 LAB — CBC
HCT: 46.3 % — ABNORMAL HIGH (ref 36.0–46.0)
Hemoglobin: 14.3 g/dL (ref 12.0–15.0)
MCH: 26.2 pg (ref 26.0–34.0)
MCHC: 30.9 g/dL (ref 30.0–36.0)
MCV: 85 fL (ref 80.0–100.0)
Platelets: 278 10*3/uL (ref 150–400)
RBC: 5.45 MIL/uL — ABNORMAL HIGH (ref 3.87–5.11)
RDW: 14.4 % (ref 11.5–15.5)
WBC: 7.2 10*3/uL (ref 4.0–10.5)
nRBC: 0 % (ref 0.0–0.2)

## 2021-02-22 NOTE — ED Notes (Signed)
Patient's brother Milta Deiters called wanting an update on patient's care. Update given at this time.

## 2021-02-22 NOTE — ED Notes (Signed)
[facility] called for transport back to [facility].

## 2021-02-22 NOTE — ED Provider Notes (Signed)
[facility] DEPT Provider Note   CSN: [id number] Arrival date & time: 02/22/21  1545     History  Chief Complaint  Patient presents with   Head Laceration   Fall    Amanda Randall is a 88 y.o. female.  HPI  Patient has history dementia, hypertension, iron deficiency anemia and breast cancer according to medical records.  Patient is not on any anticoagulants.  Patient presents to the ED for evaluation after a fall.  According to the EMS report the patient was at [facility].  She was seen normally at around lunchtime.  She was found on the floor at about 3:30 in the morning with evidence of contusion and dried blood on left side of her head.  EMS brought her to the ED for evaluation.  Patient is alert and awake but is demented.  She is able to answer some questions and laughs but her speech at times his nonsensical and she is not able to tell me what happened.  Patient denies any particular complaints.  When I examined her she does acknowledge that the left side of her head is tender.  Home Medications Prior to Admission medications   Medication Sig Start Date End Date Taking? Authorizing Provider  acetaminophen (TYLENOL) 325 MG tablet Take 650 mg by mouth every 6 (six) hours as needed for mild pain.    [provider]  citalopram (CELEXA) 20 MG tablet Take 20 mg by mouth daily.    [provider]  ENSURE (ENSURE) Take 1 Can by mouth 2 (two) times daily between meals. VANILLA    [provider]  ferrous gluconate (FERGON) 324 MG tablet Take 324 mg by mouth every Monday, Wednesday, and Friday.    [provider]  memantine (NAMENDA) 10 MG tablet Take 10 mg by mouth 2 (two) times daily.    [provider]  metoprolol tartrate (LOPRESSOR) 25 MG tablet Take 1 tablet (25 mg total) by mouth 2 (two) times daily. 05/28/20   Fenton, Clint R, PA  mirtazapine (REMERON) 15 MG tablet Take 15 mg by mouth at bedtime. 04/09/20    [provider]  mirtazapine (REMERON) 7.5 MG tablet Take 7.5 mg by mouth at bedtime. 05/22/20   [provider]  Multiple Vitamin (MULTIVITAMIN WITH MINERALS) TABS tablet Take 1 tablet by mouth daily.    [provider]  polyethylene glycol (MIRALAX / GLYCOLAX) packet Take 17 g by mouth daily.    [provider]  rivastigmine (EXELON) 3 MG capsule Take 3 mg by mouth 2 (two) times daily.    [provider]  vitamin B-12 (CYANOCOBALAMIN) 250 MCG tablet Take 250 mcg by mouth daily.    [provider]      Allergies    Patient has no known allergies.    Review of Systems   Review of Systems  Unable to perform ROS: Dementia   Physical Exam Updated Vital Signs BP 114/81    Pulse 72    Temp 97.6 F (36.4 C) (Oral)    Resp 18    SpO2 96%  Physical Exam Vitals and nursing note reviewed.  Constitutional:      General: She is not in acute distress.    Appearance: She is well-developed.  HENT:     Head: Normocephalic.     Comments: Contusion left frontal temporal region, small 1 cm laceration, no active bleeding, dried blood around the wound as well as in her hair  Right Ear: External ear normal.     Left Ear: External ear normal.  Eyes:     General: No scleral icterus.       Right eye: No discharge.        Left eye: No discharge.     Conjunctiva/sclera: Conjunctivae normal.  Neck:     Trachea: No tracheal deviation.  Cardiovascular:     Rate and Rhythm: Normal rate and regular rhythm.  Pulmonary:     Effort: Pulmonary effort is normal. No respiratory distress.     Breath sounds: Normal breath sounds. No stridor. No wheezing or rales.  Abdominal:     General: Bowel sounds are normal. There is no distension.     Palpations: Abdomen is soft.     Tenderness: There is no abdominal tenderness. There is no guarding or rebound.  Musculoskeletal:        General: No tenderness or deformity.     Cervical back: Normal and neck supple.      Thoracic back: Normal.     Lumbar back: Normal.     Comments: Full range of motion bilateral in upper and lower extremities with apparent pain or discomfort, no tenderness palpation, no swelling or deformity noted  Skin:    General: Skin is warm and dry.     Findings: No rash.  Neurological:     General: No focal deficit present.     Mental Status: She is alert.     Cranial Nerves: No cranial nerve deficit (no facial droop, extraocular movements intact, no slurred speech).     Sensory: No sensory deficit.     Motor: No abnormal muscle tone or seizure activity.     Coordination: Coordination normal.  Psychiatric:        Mood and Affect: Mood normal.    ED Results / Procedures / Treatments   Labs (all labs ordered are listed, but only abnormal results are displayed) Labs Reviewed  CBC - Abnormal; Notable for the following components:      Result Value   RBC 5.45 (*)    HCT 46.3 (*)    All other components within normal limits  BASIC METABOLIC PANEL - Abnormal; Notable for the following components:   Glucose, Bld 107 (*)    BUN 26 (*)    All other components within normal limits    EKG EKG Interpretation  Date/Time:  Sunday February 22 2021 16:33:04 EST Ventricular Rate:  61 PR Interval:  209 QRS Duration: 118 QT Interval:  434 QTC Calculation: 438 R Axis:   -58 Text Interpretation: Sinus rhythm Incomplete RBBB and LAFB No significant change since last tracing Confirmed by Dorie Rank [phone]) on 02/22/2021 4:35:45 PM  Radiology CT Head Wo Contrast  Result Date: 02/22/2021 CLINICAL DATA:  Patient found on the floor with a laceration to the left side of the head. EXAM: CT HEAD WITHOUT CONTRAST CT CERVICAL SPINE WITHOUT CONTRAST TECHNIQUE: Multidetector CT imaging of the head and cervical spine was performed following the standard protocol without intravenous contrast. Multiplanar CT image reconstructions of the cervical spine were also generated. RADIATION DOSE REDUCTION:  This exam was performed according to the departmental dose-optimization program which includes automated exposure control, adjustment of the mA and/or kV according to patient size and/or use of iterative reconstruction technique. COMPARISON:  10/14/2018 FINDINGS: CT HEAD FINDINGS Brain: No evidence of acute infarction, hemorrhage, hydrocephalus, extra-axial collection or mass lesion/mass effect. There is ventricular and sulcal enlargement reflecting age related volume loss. Patchy hypoattenuation  is noted in the white matter consistent with moderate chronic microvascular ischemic change. These findings are stable. Vascular: No hyperdense vessel or unexpected calcification. Skull: Normal. Negative for fracture or focal lesion. Sinuses/Orbits: Globes and orbits are unremarkable. Small amount of fluid and a hypoplastic left sphenoid sinus. Sinuses otherwise clear. Other: Left frontal scalp hematoma. CT CERVICAL SPINE FINDINGS Alignment: Kyphosis, apex at C5. Mild anterolisthesis of C3 on C4 and C4 on C5, which appears degenerative. Skull base and vertebrae: No acute fracture. No primary bone lesion or focal pathologic process. Soft tissues and spinal canal: No prevertebral fluid or swelling. No visible canal hematoma. Disc levels: Mild loss of disc height at C4-C5. Moderate loss of disc height at C5-C6 and C6-C7. Facet degenerative change, most evident at C3-C4 bilaterally as well as on the left at C2-C3 and C4-C5. No convincing disc herniation. Upper chest: No acute findings. Apical pleuroparenchymal scarring, right greater than left. Other: None. IMPRESSION: HEAD CT 1. No acute intracranial abnormalities. 2. Left frontal scalp hematoma. CERVICAL CT 1. No fracture or acute finding. Electronically Signed   By: Lajean Manes M.D.   On: 02/22/2021 17:05   CT Cervical Spine Wo Contrast  Result Date: 02/22/2021 CLINICAL DATA:  Patient found on the floor with a laceration to the left side of the head. EXAM: CT HEAD  WITHOUT CONTRAST CT CERVICAL SPINE WITHOUT CONTRAST TECHNIQUE: Multidetector CT imaging of the head and cervical spine was performed following the standard protocol without intravenous contrast. Multiplanar CT image reconstructions of the cervical spine were also generated. RADIATION DOSE REDUCTION: This exam was performed according to the departmental dose-optimization program which includes automated exposure control, adjustment of the mA and/or kV according to patient size and/or use of iterative reconstruction technique. COMPARISON:  10/14/2018 FINDINGS: CT HEAD FINDINGS Brain: No evidence of acute infarction, hemorrhage, hydrocephalus, extra-axial collection or mass lesion/mass effect. There is ventricular and sulcal enlargement reflecting age related volume loss. Patchy hypoattenuation is noted in the white matter consistent with moderate chronic microvascular ischemic change. These findings are stable. Vascular: No hyperdense vessel or unexpected calcification. Skull: Normal. Negative for fracture or focal lesion. Sinuses/Orbits: Globes and orbits are unremarkable. Small amount of fluid and a hypoplastic left sphenoid sinus. Sinuses otherwise clear. Other: Left frontal scalp hematoma. CT CERVICAL SPINE FINDINGS Alignment: Kyphosis, apex at C5. Mild anterolisthesis of C3 on C4 and C4 on C5, which appears degenerative. Skull base and vertebrae: No acute fracture. No primary bone lesion or focal pathologic process. Soft tissues and spinal canal: No prevertebral fluid or swelling. No visible canal hematoma. Disc levels: Mild loss of disc height at C4-C5. Moderate loss of disc height at C5-C6 and C6-C7. Facet degenerative change, most evident at C3-C4 bilaterally as well as on the left at C2-C3 and C4-C5. No convincing disc herniation. Upper chest: No acute findings. Apical pleuroparenchymal scarring, right greater than left. Other: None. IMPRESSION: HEAD CT 1. No acute intracranial abnormalities. 2. Left frontal  scalp hematoma. CERVICAL CT 1. No fracture or acute finding. Electronically Signed   By: Lajean Manes M.D.   On: 02/22/2021 17:05    Procedures .Marland KitchenLaceration Repair  Date/Time: 02/22/2021 4:30 PM Performed by: Dorie Rank, MD Authorized by: Dorie Rank, MD   Consent:    Consent obtained:  Verbal   Consent given by:  Patient   Risks discussed:  Infection, need for additional repair, pain, poor cosmetic result and poor wound healing   Alternatives discussed:  No treatment and delayed treatment Universal protocol:  Patient identity confirmed:  Verbally with patient Anesthesia:    Anesthesia method:  None Treatment:    Area cleansed with:  Shur-Clens   Debridement:  None Skin repair:    Repair method:  Tissue adhesive Approximation:    Approximation:  Close Repair type:    Repair type:  Simple Post-procedure details:    Procedure completion:  Tolerated well, no immediate complications    Medications Ordered in ED Medications - No data to display  ED Course/ Medical Decision Making/ A&P Clinical Course as of 02/22/21 1812  Sun Feb 22, 2021  1750 CBC is normal. [JK]  0539 Metabolic panel normal. [JK]  1751 Head CT and C-spine CT without acute abnormalities [JK]    Clinical Course User Index [JK] Dorie Rank, MD                           Medical Decision Making  Patient presented to the ED for evaluation after a fall.  Most likely mechanical fall although not witnessed.  Patient without focal neurologic deficits.  No signs of stroke.  She appears to be at her baseline.  Laboratory tests are reassuring.  No signs of anemia or dehydration.  With her age and head injury head CT was performed to rule out any signs of intracranial bleeding.  Head CT and C-spine CT are negative.  Superficial laceration was repaired.  Patient has remained stable and is now stable for discharge back to her nursing facility.        Final Clinical Impression(s) / ED Diagnoses Final diagnoses:   Injury of head, initial encounter  Laceration of head without foreign body, unspecified part of head, initial encounter    Rx / DC Orders ED Discharge Orders     None         Dorie Rank, MD 02/22/21 1812

## 2021-02-22 NOTE — ED Notes (Signed)
Attempted to call report x 2 to [facility], unsuccessful at this time.

## 2021-02-22 NOTE — Discharge Instructions (Signed)
The x-rays did not show any signs of serious injury.  Dermabond was used to close the laceration.  You can gently use water to rinse your hair.  Avoid scratching or irritating the cut.

## 2021-02-22 NOTE — ED Triage Notes (Signed)
BIBA Per EMS: Pt coming from [facility] w/ C/o fall, Last seen at lunch time, found pt on floor at facility around 3:30.  Lac to left side of head - dried blood. In c-collar  Denies blood thinners.  160/80  60HR  97% RA

## 2021-03-17 ENCOUNTER — Ambulatory Visit: Payer: Medicare Other | Admitting: Cardiology

## 2021-04-08 ENCOUNTER — Encounter (HOSPITAL_COMMUNITY): Payer: Self-pay

## 2021-04-08 ENCOUNTER — Emergency Department (HOSPITAL_COMMUNITY)
Admission: EM | Admit: 2021-04-08 | Discharge: 2021-04-08 | Disposition: A | Discharge status: Home / Self Care | Payer: Medicare Other | Attending: Emergency Medicine | Admitting: Emergency Medicine

## 2021-04-08 ENCOUNTER — Emergency Department (HOSPITAL_COMMUNITY): Payer: Medicare Other

## 2021-04-08 ENCOUNTER — Other Ambulatory Visit: Payer: Self-pay

## 2021-04-08 DIAGNOSIS — Z9010 Acquired absence of unspecified breast and nipple: Secondary | ICD-10-CM | POA: Insufficient documentation

## 2021-04-08 DIAGNOSIS — I10 Essential (primary) hypertension: Secondary | ICD-10-CM | POA: Insufficient documentation

## 2021-04-08 DIAGNOSIS — S0081XA Abrasion of other part of head, initial encounter: Secondary | ICD-10-CM | POA: Insufficient documentation

## 2021-04-08 DIAGNOSIS — W19XXXA Unspecified fall, initial encounter: Secondary | ICD-10-CM | POA: Diagnosis not present

## 2021-04-08 DIAGNOSIS — F0390 Unspecified dementia without behavioral disturbance: Secondary | ICD-10-CM | POA: Diagnosis not present

## 2021-04-08 DIAGNOSIS — Z853 Personal history of malignant neoplasm of breast: Secondary | ICD-10-CM | POA: Insufficient documentation

## 2021-04-08 DIAGNOSIS — S0083XA Contusion of other part of head, initial encounter: Secondary | ICD-10-CM

## 2021-04-08 DIAGNOSIS — Z79899 Other long term (current) drug therapy: Secondary | ICD-10-CM | POA: Diagnosis not present

## 2021-04-08 DIAGNOSIS — S0993XA Unspecified injury of face, initial encounter: Secondary | ICD-10-CM | POA: Diagnosis present

## 2021-04-08 NOTE — ED Notes (Signed)
Due to pt sleeping vitals will be taken when she wakes up. RN was notified.  ?

## 2021-04-08 NOTE — ED Notes (Signed)
Patient refused to clean wound and keep removing the bandage. ?

## 2021-04-08 NOTE — ED Triage Notes (Signed)
Patient brought in via ems from [facility] dementia unit. Patient was found down in courtyard. Patient is at baseline, oriented to self, per facility. Patient may have slipped on acorns. Patient unable to recall the fall. Patient has lacerations to left side of head, bleeding controlled  ?

## 2021-04-08 NOTE — ED Provider Notes (Signed)
?[facility] DEPT ?Provider Note ? ? ?CSN: [id number] ?Arrival date & time: 04/08/21  1828 ? ?  ? ?History ? ?Chief Complaint  ?Patient presents with  ? Fall  ? ? ?Amanda Randall is a 88 y.o. female. ? ?Pt is a 88 y/o female with hx of advanced dementia, iron def anemia, breast cancer with mastectomy, PAD, HTN, HLD, and SVT/atrial tach who is presenting today from her nursing facility with EMS after a fall.  She lives at [facility] and they reported that they found the patient down in the [place] and it appears that she most likely slipped on some acorns.  Unfortunately because of patient's dementia she cannot recall anything that happened and even reports that we must have the wrong patient because she is fine.  Per the facility patient is at her baseline.  She denies any pain or discomfort. ? ?The history is provided by the nursing home and the EMS personnel.  ?Fall ? ? ?  ? ?Home Medications ?Prior to Admission medications   ?Medication Sig Start Date End Date Taking? Authorizing Provider  ?acetaminophen (TYLENOL) 325 MG tablet Take 650 mg by mouth every 6 (six) hours as needed for mild pain.    [provider]  ?citalopram (CELEXA) 20 MG tablet Take 20 mg by mouth daily.    [provider]  ?ENSURE (ENSURE) Take 1 Can by mouth 2 (two) times daily between meals. VANILLA    [provider]  ?ferrous gluconate (FERGON) 324 MG tablet Take 324 mg by mouth every Monday, Wednesday, and Friday.    [provider]  ?memantine (NAMENDA) 10 MG tablet Take 10 mg by mouth 2 (two) times daily.    [provider]  ?metoprolol tartrate (LOPRESSOR) 25 MG tablet Take 1 tablet (25 mg total) by mouth 2 (two) times daily. 05/28/20   Fenton, Clint R, PA  ?mirtazapine (REMERON) 15 MG tablet Take 15 mg by mouth at bedtime. 04/09/20   [provider]  ?mirtazapine (REMERON) 7.5 MG tablet Take 7.5 mg by mouth at bedtime. 05/22/20   [provider]   ?Multiple Vitamin (MULTIVITAMIN WITH MINERALS) TABS tablet Take 1 tablet by mouth daily.    [provider]  ?polyethylene glycol (MIRALAX / GLYCOLAX) packet Take 17 g by mouth daily.    [provider]  ?rivastigmine (EXELON) 3 MG capsule Take 3 mg by mouth 2 (two) times daily.    [provider]  ?vitamin B-12 (CYANOCOBALAMIN) 250 MCG tablet Take 250 mcg by mouth daily.    [provider]  ?   ? ?Allergies    ?Patient has no known allergies.   ? ?Review of Systems   ?Review of Systems ? ?Physical Exam ?Updated Vital Signs ?BP (!) 104/93 (BP Location: Right Arm)   Pulse 66   Temp 97.9 ?F (36.6 ?C) (Oral)   Resp 16   Ht 5\' 1"  (1.549 m)   Wt 44.5 kg   SpO2 92%   BMI 18.52 kg/m?  ?Physical Exam ?Vitals and nursing note reviewed.  ?Constitutional:   ?   General: She is not in acute distress. ?   Appearance: Normal appearance. She is well-developed.  ?HENT:  ?   Head: Normocephalic and atraumatic.  ?   Comments: Abrasions noted to the left side of the face ?Eyes:  ?   Pupils: Pupils are equal, round, and reactive to light.  ?Neck:  ?   Comments: No notable neck pain ?Cardiovascular:  ?  Rate and Rhythm: Normal rate and regular rhythm.  ?   Heart sounds: Normal heart sounds. No murmur heard. ?  No friction rub.  ?Pulmonary:  ?   Effort: Pulmonary effort is normal.  ?   Breath sounds: Normal breath sounds. No wheezing or rales.  ?Abdominal:  ?   General: Bowel sounds are normal. There is no distension.  ?   Palpations: Abdomen is soft.  ?   Tenderness: There is no abdominal tenderness. There is no guarding or rebound.  ?Musculoskeletal:     ?   General: No tenderness. Normal range of motion.  ?   Cervical back: Normal range of motion and neck supple.  ?   Right lower leg: No edema.  ?   Left lower leg: No edema.  ?   Comments: No edema  ?Skin: ?   General: Skin is warm and dry.  ?   Findings: No rash.  ?Neurological:  ?   Mental Status: She is alert.  ?   Cranial Nerves: No  cranial nerve deficit.  ?   Comments: Oriented to self only.  Witnessed to be moving all extremities without difficulty.  Stands without difficulty  ?Psychiatric:  ?   Comments: Calm and cooperative  ? ? ?ED Results / Procedures / Treatments   ?Labs ?(all labs ordered are listed, but only abnormal results are displayed) ?Labs Reviewed - No data to display ? ?EKG ?None ? ?Radiology ?CT Head Wo Contrast ? ?Result Date: 04/08/2021 ?CLINICAL DATA:  Head trauma, minor (Age >= 65y) EXAM: CT HEAD WITHOUT CONTRAST TECHNIQUE: Contiguous axial images were obtained from the base of the skull through the vertex without intravenous contrast. RADIATION DOSE REDUCTION: This exam was performed according to the departmental dose-optimization program which includes automated exposure control, adjustment of the mA and/or kV according to patient size and/or use of iterative reconstruction technique. COMPARISON:  None. BRAIN: BRAIN Cerebral ventricle sizes are concordant with the degree of cerebral volume loss. Patchy and confluent areas of decreased attenuation are noted throughout the deep and periventricular white matter of the cerebral hemispheres bilaterally, compatible with chronic microvascular ischemic disease. No evidence of large-territorial acute infarction. No parenchymal hemorrhage. No mass lesion. No extra-axial collection. No mass effect or midline shift. No hydrocephalus. Basilar cisterns are patent. Vascular: No hyperdense vessel. Atherosclerotic calcifications are present within the cavernous internal carotid and vertebral arteries. Skull: No acute fracture or focal lesion. Sinuses/Orbits: Right mastoid air cell effusion. No definite fluid in the middle ear. Paranasal sinuses and left mastoid air cells are clear. Bilateral lens replacement. Otherwise the orbits are unremarkable. Other: None. IMPRESSION: 1. No acute intracranial abnormality. 2. Right mastoid air cell effusion. Electronically Signed   By: Iven Finn  M.D.   On: 04/08/2021 20:06   ? ?Procedures ?Procedures  ? ? ?Medications Ordered in ED ?Medications - No data to display ? ?ED Course/ Medical Decision Making/ A&P ?  ?                        ?Medical Decision Making ?Amount and/or Complexity of Data Reviewed ?Independent Historian: caregiver and EMS ?   Details: [facility] ?External Data Reviewed: notes. ?   Details: from the cardiologist ?Radiology: ordered and independent interpretation performed. Decision-making details documented in ED Course. ? ? ?Patient being brought in today after a fall at her living facility.  It was thought that patient most likely tripped and fell on some acorns.  She is well-appearing here.  She does have some abrasions to the left side of her face but no other significant findings.  She does not take any anticoagulation.  She seems to be moving all extremities without difficulty and low suspicion for extremity fracture, rib fractures or acute abdominal injury.  She has no neck pain.  Head CT was done due to unreliable history based on patient's dementia.  I independently viewed and interpreted head CT which shows no sign of intracranial hemorrhage.  Radiology reported no acute findings.  She does have a right mastoid air cell effusion.  Patient's vital signs are within normal limits and otherwise she appears stable for discharge back to her facility.  No for admission at this time ? ? ? ? ? ? ? ?Final Clinical Impression(s) / ED Diagnoses ?Final diagnoses:  ?Fall, initial encounter  ?Contusion of face, initial encounter  ? ? ?Rx / DC Orders ?ED Discharge Orders   ? ? None  ? ?  ? ? ?  ?Blanchie Dessert, MD ?04/08/21 2030 ? ?

## 2021-04-08 NOTE — Discharge Instructions (Signed)
Cat scan of head for normal today.  Use soap/water and vasoline as needed for the scratches on her face. ?

## 2021-12-09 DEATH — deceased

## 2022-03-18 ENCOUNTER — Encounter (HOSPITAL_COMMUNITY): Payer: Self-pay | Admitting: *Deleted
